# Patient Record
Sex: Male | Born: 1945 | Race: White | Hispanic: No | Smoking: Former smoker
Health system: Southern US, Community
[De-identification: ages and names within clinical notes are randomized; demographics above are authoritative.]

## PROBLEM LIST (undated history)

## (undated) DIAGNOSIS — I671 Cerebral aneurysm, nonruptured: Secondary | ICD-10-CM

## (undated) DIAGNOSIS — K219 Gastro-esophageal reflux disease without esophagitis: Secondary | ICD-10-CM

## (undated) DIAGNOSIS — J189 Pneumonia, unspecified organism: Secondary | ICD-10-CM

## (undated) DIAGNOSIS — R569 Unspecified convulsions: Secondary | ICD-10-CM

## (undated) DIAGNOSIS — I639 Cerebral infarction, unspecified: Secondary | ICD-10-CM

## (undated) DIAGNOSIS — I10 Essential (primary) hypertension: Secondary | ICD-10-CM

## (undated) DIAGNOSIS — E7800 Pure hypercholesterolemia, unspecified: Secondary | ICD-10-CM

## (undated) DIAGNOSIS — N400 Enlarged prostate without lower urinary tract symptoms: Secondary | ICD-10-CM

## (undated) DIAGNOSIS — G40909 Epilepsy, unspecified, not intractable, without status epilepticus: Secondary | ICD-10-CM

## (undated) DIAGNOSIS — G2581 Restless legs syndrome: Secondary | ICD-10-CM

## (undated) HISTORY — PX: KNEE ARTHROPLASTY: SHX992

## (undated) HISTORY — PX: CEREBRAL ANEURYSM REPAIR: SHX164

## (undated) HISTORY — PX: CHOLECYSTECTOMY: SHX55

---

## 1999-01-24 ENCOUNTER — Encounter: Payer: Self-pay | Admitting: Emergency Medicine

## 1999-01-24 ENCOUNTER — Emergency Department (HOSPITAL_COMMUNITY): Admission: EM | Admit: 1999-01-24 | Discharge: 1999-01-25 | Payer: Self-pay | Admitting: Emergency Medicine

## 1999-08-12 ENCOUNTER — Emergency Department (HOSPITAL_COMMUNITY): Admission: EM | Admit: 1999-08-12 | Discharge: 1999-08-12 | Payer: Self-pay | Admitting: Emergency Medicine

## 1999-08-12 ENCOUNTER — Encounter: Payer: Self-pay | Admitting: Emergency Medicine

## 2000-05-22 ENCOUNTER — Ambulatory Visit: Admission: RE | Admit: 2000-05-22 | Discharge: 2000-05-22 | Payer: Self-pay | Admitting: *Deleted

## 2000-06-11 ENCOUNTER — Emergency Department (HOSPITAL_COMMUNITY): Admission: EM | Admit: 2000-06-11 | Discharge: 2000-06-11 | Payer: Self-pay | Admitting: Emergency Medicine

## 2000-06-27 ENCOUNTER — Encounter: Payer: Self-pay | Admitting: Surgery

## 2000-06-27 ENCOUNTER — Observation Stay (HOSPITAL_COMMUNITY): Admission: RE | Admit: 2000-06-27 | Discharge: 2000-06-28 | Payer: Self-pay | Admitting: Surgery

## 2000-06-27 ENCOUNTER — Encounter (INDEPENDENT_AMBULATORY_CARE_PROVIDER_SITE_OTHER): Payer: Self-pay

## 2003-10-13 ENCOUNTER — Encounter: Payer: Self-pay | Admitting: Emergency Medicine

## 2003-10-14 ENCOUNTER — Inpatient Hospital Stay (HOSPITAL_COMMUNITY): Admission: EM | Admit: 2003-10-14 | Discharge: 2003-10-16 | Payer: Self-pay

## 2003-10-15 ENCOUNTER — Encounter: Payer: Self-pay | Admitting: Cardiology

## 2009-06-08 ENCOUNTER — Encounter (INDEPENDENT_AMBULATORY_CARE_PROVIDER_SITE_OTHER): Payer: Self-pay | Admitting: Internal Medicine

## 2009-06-08 ENCOUNTER — Ambulatory Visit: Payer: Self-pay | Admitting: Nurse Practitioner

## 2009-06-08 ENCOUNTER — Observation Stay (HOSPITAL_COMMUNITY): Admission: EM | Admit: 2009-06-08 | Discharge: 2009-06-08 | Payer: Self-pay | Admitting: Emergency Medicine

## 2009-07-01 ENCOUNTER — Emergency Department (HOSPITAL_COMMUNITY): Admission: EM | Admit: 2009-07-01 | Discharge: 2009-07-02 | Payer: Self-pay | Admitting: Emergency Medicine

## 2010-10-19 LAB — RAPID URINE DRUG SCREEN, HOSP PERFORMED
Cocaine: NOT DETECTED
Tetrahydrocannabinol: NOT DETECTED

## 2010-10-19 LAB — POCT CARDIAC MARKERS
CKMB, poc: 3.1 ng/mL (ref 1.0–8.0)
Troponin i, poc: <0.05 ng/mL (ref 0.00–0.09)

## 2010-10-19 LAB — CBC
HCT: 40.3 % (ref 39.0–52.0)
Hemoglobin: 13.6 g/dL (ref 13.0–17.0)
MCHC: 33.7 g/dL (ref 30.0–36.0)
MCV: 97.3 fL (ref 78.0–100.0)
RBC: 4.15 MIL/uL — ABNORMAL LOW (ref 4.22–5.81)
RDW: 12.9 % (ref 11.5–15.5)

## 2010-10-19 LAB — COMPREHENSIVE METABOLIC PANEL
ALT: 34 U/L (ref 0–53)
BUN: 13 mg/dL (ref 6–23)
CO2: 28 mEq/L (ref 19–32)
Calcium: 9.2 mg/dL (ref 8.4–10.5)
GFR calc non Af Amer: >60 mL/min (ref >60)
Glucose, Bld: 102 mg/dL — ABNORMAL HIGH (ref 70–99)
Total Protein: 6.5 g/dL (ref 6.0–8.3)

## 2010-10-19 LAB — DIFFERENTIAL
Basophils Relative: 0 % (ref 0–1)
Eosinophils Absolute: 0.4 10*3/uL (ref 0.0–0.7)
Lymphs Abs: 1.3 10*3/uL (ref 0.7–4.0)
Monocytes Relative: 5 % (ref 3–12)
Neutro Abs: 8.1 10*3/uL — ABNORMAL HIGH (ref 1.7–7.7)
Neutrophils Relative %: 78 % — ABNORMAL HIGH (ref 43–77)

## 2010-10-19 LAB — PROTIME-INR
INR: 0.94 (ref 0.00–1.49)
Prothrombin Time: 12.5 seconds (ref 11.6–15.2)

## 2010-10-19 LAB — APTT: aPTT: 28 seconds (ref 24–37)

## 2010-10-20 LAB — POCT CARDIAC MARKERS

## 2010-10-20 LAB — TROPONIN I
Troponin I: 0.01 ng/mL (ref 0.00–0.06)
Troponin I: 0.01 ng/mL (ref 0.00–0.06)

## 2010-10-20 LAB — POCT I-STAT, CHEM 8
BUN: 16 mg/dL (ref 6–23)
Calcium, Ion: 1.13 mmol/L (ref 1.12–1.32)
HCT: 40 % (ref 39.0–52.0)
Hemoglobin: 13.6 g/dL (ref 13.0–17.0)
Sodium: 141 mEq/L (ref 135–145)
TCO2: 27 mmol/L (ref 0–100)

## 2010-10-20 LAB — DIFFERENTIAL
Basophils Absolute: 0 10*3/uL (ref 0.0–0.1)
Basophils Relative: 0 % (ref 0–1)
Eosinophils Absolute: 0.2 10*3/uL (ref 0.0–0.7)
Eosinophils Relative: 3 % (ref 0–5)
Neutrophils Relative %: 63 % (ref 43–77)

## 2010-10-20 LAB — CBC
Platelets: 403 10*3/uL — ABNORMAL HIGH (ref 150–400)
RDW: 12.5 % (ref 11.5–15.5)
WBC: 7.3 10*3/uL (ref 4.0–10.5)

## 2010-10-20 LAB — CK TOTAL AND CKMB (NOT AT ARMC)
CK, MB: 5.5 ng/mL — ABNORMAL HIGH (ref 0.3–4.0)
Relative Index: 1.8 (ref 0.0–2.5)
Total CK: 290 U/L — ABNORMAL HIGH (ref 7–232)
Total CK: 304 U/L — ABNORMAL HIGH (ref 7–232)

## 2010-10-20 LAB — D-DIMER, QUANTITATIVE: D-Dimer, Quant: 0.26 ug/mL-FEU (ref 0.00–0.48)
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
## 2012-01-02 ENCOUNTER — Emergency Department (HOSPITAL_BASED_OUTPATIENT_CLINIC_OR_DEPARTMENT_OTHER)
Admission: EM | Admit: 2012-01-02 | Discharge: 2012-01-03 | Disposition: A | Discharge status: Other Acute Inpatient Hospital | Payer: Self-pay | Attending: Emergency Medicine | Admitting: Emergency Medicine

## 2012-01-02 ENCOUNTER — Emergency Department (HOSPITAL_BASED_OUTPATIENT_CLINIC_OR_DEPARTMENT_OTHER): Payer: Self-pay

## 2012-01-02 ENCOUNTER — Encounter (HOSPITAL_BASED_OUTPATIENT_CLINIC_OR_DEPARTMENT_OTHER): Payer: Self-pay | Admitting: Student

## 2012-01-02 DIAGNOSIS — R42 Dizziness and giddiness: Secondary | ICD-10-CM | POA: Insufficient documentation

## 2012-01-02 DIAGNOSIS — G459 Transient cerebral ischemic attack, unspecified: Secondary | ICD-10-CM | POA: Insufficient documentation

## 2012-01-02 DIAGNOSIS — R4182 Altered mental status, unspecified: Secondary | ICD-10-CM | POA: Insufficient documentation

## 2012-01-02 HISTORY — DX: Restless legs syndrome: G25.81

## 2012-01-02 HISTORY — DX: Epilepsy, unspecified, not intractable, without status epilepticus: G40.909

## 2012-01-02 HISTORY — DX: Cerebral aneurysm, nonruptured: I67.1

## 2012-01-02 HISTORY — DX: Pure hypercholesterolemia, unspecified: E78.00

## 2012-01-02 HISTORY — DX: Essential (primary) hypertension: I10

## 2012-01-02 HISTORY — DX: Benign prostatic hyperplasia without lower urinary tract symptoms: N40.0

## 2012-01-02 LAB — COMPREHENSIVE METABOLIC PANEL
BUN: 17 mg/dL (ref 6–23)
CO2: 24 mEq/L (ref 19–32)
Calcium: 9.7 mg/dL (ref 8.4–10.5)
Creatinine, Ser: 0.8 mg/dL (ref 0.50–1.35)
GFR calc Af Amer: >90 mL/min (ref >90)
GFR calc non Af Amer: >90 mL/min (ref >90)
Glucose, Bld: 118 mg/dL — ABNORMAL HIGH (ref 70–99)

## 2012-01-02 LAB — PROTIME-INR: INR: 0.99 (ref 0.00–1.49)

## 2012-01-02 LAB — CBC
HCT: 43 % (ref 39.0–52.0)
MCH: 32.4 pg (ref 26.0–34.0)
MCHC: 35.8 g/dL (ref 30.0–36.0)
MCV: 90.5 fL (ref 78.0–100.0)
Platelets: 352 10*3/uL (ref 150–400)
RDW: 12.5 % (ref 11.5–15.5)

## 2012-01-02 LAB — DIFFERENTIAL
Basophils Absolute: 0 10*3/uL (ref 0.0–0.1)
Eosinophils Absolute: 0.3 10*3/uL (ref 0.0–0.7)
Eosinophils Relative: 3 % (ref 0–5)
Monocytes Absolute: 0.6 10*3/uL (ref 0.1–1.0)

## 2012-01-02 NOTE — ED Notes (Signed)
Pt transported to CT at this time.

## 2012-01-02 NOTE — ED Notes (Signed)
MD at bedside. 

## 2012-01-02 NOTE — ED Notes (Signed)
Pt in via EMS from home with c/o of sudden onset of headache, confusion, extreme dizziness and possible global aphasia. 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 Per EMS pt had trouble finishing sentences and completing thoughts. Resolution upon arrival and pt is able to complete sentence and a/o x 4. Grips = and PERRLA.

## 2012-01-02 NOTE — ED Provider Notes (Signed)
History   This chart was scribed for Noah Lee Noah Cords, MD by [name]. The patient was seen in room MH01/MH01 and the patient's care was started at 11:36 PM     CSN: [id number]  Arrival date & time 01/02/12  2254   First MD Initiated Contact with Patient 01/02/12 2325      Chief Complaint  Patient presents with  . 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   Noah Lee is a 66 y.o. male who presents to the Emergency Department complaining of severe, episodic dizziness onset today with associated symptoms of headache. The pt states "I was out in the sun all day and didn't have a problem." Pt has a hx of brain aneurism in October (2012), for which he received surgical intervention at [facility] in [place], [place]. Patient states he was dizzy this afternoon and went home and then went back to the pool to get mail at approximately 4 pm.  States he had occipital HA and dizziness and felt he was going to fall and couldn't call 911 or finish his sentences.  States his lasted until medics arrived his evening and he states hthey noticed he could not finish his sentence.  Transported here but symptoms resolved.  No weakness that's new.  States this is the same as when his aneurysm was coiled  Pt denies taking coumadin at this time.   PCP is Dr. Arlyce Dice ([facility] [place]). Pt lives in [place], [place].   Brain Surgeon is Dr. Joya Martyr at [facility] in [place], [place].    Past Surgical History  Procedure Date  . Cerebral aneurysm repair     No family history on file.  History  Substance Use Topics  . Smoking status: Not on file  . Smokeless tobacco: Not on file  . Alcohol Use:       Review of Systems  Respiratory: Negative for shortness of breath.   Cardiovascular: Negative for chest pain.    Neurological: Positive for dizziness and speech difficulty. Negative for seizures, facial asymmetry, weakness and numbness.  All other systems reviewed and are negative.    10 Systems reviewed and all are negative for acute change except as noted in the HPI.    Allergies  Review of patient's allergies indicates not on file.  Home Medications   Current Outpatient Rx  Name Route Sig Dispense Refill  . AMLODIPINE BESYLATE 10 MG PO TABS Oral Take 10 mg by mouth daily.    . ATORVASTATIN CALCIUM 20 MG PO TABS Oral Take 10 mg by mouth at bedtime.    . CYANOCOBALAMIN 500 MCG PO TABS Oral Take 1,000 mcg by mouth daily.    Marland Kitchen FINASTERIDE 5 MG PO TABS Oral Take 5 mg by mouth at bedtime.    . OMEGA-3 FATTY ACIDS 1000 MG PO CAPS Oral Take 2 g by mouth 2 (two) times daily.    Marland Kitchen HYDROCHLOROTHIAZIDE 25 MG PO TABS Oral Take 25 mg by mouth daily.    Marland Kitchen LAMOTRIGINE 200 MG PO TABS Oral Take 200 mg by mouth daily.    Marland Kitchen PRAMIPEXOLE DIHYDROCHLORIDE 0.25 MG PO TABS Oral Take 0.25 mg by mouth at bedtime.    Marland Kitchen ZOLPIDEM TARTRATE 10 MG PO TABS Oral Take 10 mg by mouth at bedtime as needed.      BP 153/87  Pulse 72  Temp 98.3  F (36.8 C) (Oral)  Resp 18  SpO2 98%  Physical Exam  Nursing note and vitals reviewed. Constitutional: He is oriented to person, place, and time. He appears well-developed and well-nourished.  HENT:  Head: Normocephalic and atraumatic.  Right Ear: External ear normal.  Left Ear: External ear normal.  Nose: Nose normal.  Mouth/Throat: Oropharynx is clear and moist.  Eyes: EOM are normal. Pupils are equal, round, and reactive to light.  Neck: Normal range of motion. Neck supple.  Cardiovascular: Normal rate, regular rhythm and normal heart sounds.   Pulmonary/Chest: Effort normal and breath sounds normal.  Abdominal: Soft. Bowel sounds are normal. There is no tenderness.  Musculoskeletal: Normal range of motion. He exhibits no edema.  Neurological: He is alert and oriented to  person, place, and time. He has normal reflexes. No cranial nerve deficit. Coordination normal.       Slight pass pointing and dysmetria. Intact speech  Skin: Skin is warm and dry.  Psychiatric: He has a normal mood and affect. His behavior is normal.    ED Course  Procedures (including critical care time)  DIAGNOSTIC STUDIES: Oxygen Saturation is 98% on room air, normal by my interpretation.    COORDINATION OF CARE:   11:40PM- EDP at bedside discusses treatment plan   11:53PM- Phone Consultation with Dr. Dorena Cookey.   12:04Am- Phone Consultation with Dr. Marland Kitchen  Results for orders placed during the hospital encounter of 01/02/12  CBC      Component Value Range   WBC 7.5  4.0 - 10.5 K/uL   RBC 4.75  4.22 - 5.81 MIL/uL   Hemoglobin 15.4  13.0 - 17.0 g/dL   HCT 16.1  09.6 - 04.5 %   MCV 90.5  78.0 - 100.0 fL   MCH 32.4  26.0 - 34.0 pg   MCHC 35.8  30.0 - 36.0 g/dL   RDW 40.9  81.1 - 91.4 %   Platelets 352  150 - 400 K/uL  DIFFERENTIAL      Component Value Range   Neutrophils Relative 58  43 - 77 %   Neutro Abs 4.3  1.7 - 7.7 K/uL   Lymphocytes Relative 30  12 - 46 %   Lymphs Abs 2.2  0.7 - 4.0 K/uL   Monocytes Relative 8  3 - 12 %   Monocytes Absolute 0.6  0.1 - 1.0 K/uL   Eosinophils Relative 3  0 - 5 %   Eosinophils Absolute 0.3  0.0 - 0.7 K/uL   Basophils Relative 0  0 - 1 %   Basophils Absolute 0.0  0.0 - 0.1 K/uL  COMPREHENSIVE METABOLIC PANEL      Component Value Range   Sodium 140  135 - 145 mEq/L   Potassium 3.5  3.5 - 5.1 mEq/L   Chloride 102  96 - 112 mEq/L   CO2 24  19 - 32 mEq/L   Glucose, Bld 118 (*) 70 - 99 mg/dL   BUN 17  6 - 23 mg/dL   Creatinine, Ser 7.82  0.50 - 1.35 mg/dL   Calcium 9.7  8.4 - 95.6 mg/dL   Total Protein 7.5  6.0 - 8.3 g/dL   Albumin 4.4  3.5 - 5.2 g/dL   AST 20  0 - 37 U/L   ALT 23  0 - 53 U/L   Alkaline Phosphatase 58  39 - 117 U/L   Total Bilirubin 0.5  0.3 - 1.2 mg/dL   GFR calc non Af Amer >90  >  90 mL/min   GFR calc Af Amer  >90  >90 mL/min  PROTIME-INR      Component Value Range   Prothrombin Time 13.3  11.6 - 15.2 seconds   INR 0.99  0.00 - 1.49     No results found.   No diagnosis found.    MDM  Will transfer for TIA work up, patient requests [facility]   Meeya Goldin Noah Cords, MD 01/03/12 0865

## 2012-01-02 NOTE — ED Notes (Signed)
Took patient's CBG and result was: 112. MD showed while assessing patient.

## 2012-01-02 NOTE — ED Notes (Signed)
Per EMS: pt had dizziness that began this afternoon while at the pool. Resolved at this time. Pt states he feels like he is having difficulty finishing his sentences, and that this is similar to when he had a brain aneurysm last October which required surgical intervention. Pt alert and denies pain other than a mild headache. Hypertensive at 190 systolic. HR normal.

## 2012-01-03 ENCOUNTER — Encounter (HOSPITAL_BASED_OUTPATIENT_CLINIC_OR_DEPARTMENT_OTHER): Payer: Self-pay | Admitting: Emergency Medicine

## 2012-01-03 LAB — GLUCOSE, CAPILLARY: Glucose-Capillary: 112 mg/dL — ABNORMAL HIGH (ref 70–99)

## 2012-07-16 ENCOUNTER — Encounter (HOSPITAL_COMMUNITY): Payer: Self-pay | Admitting: Emergency Medicine

## 2012-07-16 ENCOUNTER — Observation Stay (HOSPITAL_COMMUNITY)
Admission: EM | Admit: 2012-07-16 | Discharge: 2012-07-20 | Disposition: A | Discharge status: Home / Self Care | Payer: Self-pay | Attending: Family Medicine | Admitting: Family Medicine

## 2012-07-16 ENCOUNTER — Emergency Department (HOSPITAL_COMMUNITY): Payer: Self-pay

## 2012-07-16 DIAGNOSIS — G459 Transient cerebral ischemic attack, unspecified: Principal | ICD-10-CM | POA: Insufficient documentation

## 2012-07-16 DIAGNOSIS — Z8673 Personal history of transient ischemic attack (TIA), and cerebral infarction without residual deficits: Secondary | ICD-10-CM | POA: Insufficient documentation

## 2012-07-16 DIAGNOSIS — N401 Enlarged prostate with lower urinary tract symptoms: Secondary | ICD-10-CM | POA: Insufficient documentation

## 2012-07-16 DIAGNOSIS — R32 Unspecified urinary incontinence: Secondary | ICD-10-CM

## 2012-07-16 DIAGNOSIS — W010XXA Fall on same level from slipping, tripping and stumbling without subsequent striking against object, initial encounter: Secondary | ICD-10-CM | POA: Insufficient documentation

## 2012-07-16 DIAGNOSIS — Z23 Encounter for immunization: Secondary | ICD-10-CM | POA: Insufficient documentation

## 2012-07-16 DIAGNOSIS — M25569 Pain in unspecified knee: Secondary | ICD-10-CM | POA: Insufficient documentation

## 2012-07-16 DIAGNOSIS — I10 Essential (primary) hypertension: Secondary | ICD-10-CM

## 2012-07-16 DIAGNOSIS — S0181XA Laceration without foreign body of other part of head, initial encounter: Secondary | ICD-10-CM

## 2012-07-16 DIAGNOSIS — G40909 Epilepsy, unspecified, not intractable, without status epilepticus: Secondary | ICD-10-CM | POA: Insufficient documentation

## 2012-07-16 DIAGNOSIS — S0180XA Unspecified open wound of other part of head, initial encounter: Secondary | ICD-10-CM | POA: Insufficient documentation

## 2012-07-16 DIAGNOSIS — R4182 Altered mental status, unspecified: Secondary | ICD-10-CM | POA: Insufficient documentation

## 2012-07-16 DIAGNOSIS — Y92009 Unspecified place in unspecified non-institutional (private) residence as the place of occurrence of the external cause: Secondary | ICD-10-CM | POA: Insufficient documentation

## 2012-07-16 DIAGNOSIS — R569 Unspecified convulsions: Secondary | ICD-10-CM

## 2012-07-16 DIAGNOSIS — Z79899 Other long term (current) drug therapy: Secondary | ICD-10-CM | POA: Insufficient documentation

## 2012-07-16 DIAGNOSIS — R42 Dizziness and giddiness: Secondary | ICD-10-CM | POA: Insufficient documentation

## 2012-07-16 DIAGNOSIS — N138 Other obstructive and reflux uropathy: Secondary | ICD-10-CM | POA: Insufficient documentation

## 2012-07-16 DIAGNOSIS — N400 Enlarged prostate without lower urinary tract symptoms: Secondary | ICD-10-CM | POA: Diagnosis present

## 2012-07-16 DIAGNOSIS — R209 Unspecified disturbances of skin sensation: Secondary | ICD-10-CM | POA: Insufficient documentation

## 2012-07-16 HISTORY — DX: Pneumonia, unspecified organism: J18.9

## 2012-07-16 HISTORY — DX: Gastro-esophageal reflux disease without esophagitis: K21.9

## 2012-07-16 HISTORY — DX: Unspecified convulsions: R56.9

## 2012-07-16 HISTORY — DX: Cerebral infarction, unspecified: I63.9

## 2012-07-16 LAB — URINALYSIS, ROUTINE W REFLEX MICROSCOPIC
Bilirubin Urine: NEGATIVE
Hgb urine dipstick: NEGATIVE
Nitrite: NEGATIVE
Protein, ur: NEGATIVE mg/dL
Specific Gravity, Urine: 1.018 (ref 1.005–1.030)
Urobilinogen, UA: 0.2 mg/dL (ref 0.0–1.0)

## 2012-07-16 LAB — CBC WITH DIFFERENTIAL/PLATELET
Basophils Relative: 0 % (ref 0–1)
Eosinophils Absolute: 0.1 10*3/uL (ref 0.0–0.7)
Eosinophils Relative: 2 % (ref 0–5)
MCH: 31 pg (ref 26.0–34.0)
MCHC: 32.9 g/dL (ref 30.0–36.0)
MCV: 94.3 fL (ref 78.0–100.0)
Monocytes Relative: 8 % (ref 3–12)
Neutrophils Relative %: 71 % (ref 43–77)
Platelets: 225 10*3/uL (ref 150–400)

## 2012-07-16 LAB — BASIC METABOLIC PANEL
BUN: 9 mg/dL (ref 6–23)
Calcium: 8.7 mg/dL (ref 8.4–10.5)
GFR calc Af Amer: >90 mL/min (ref >90)
GFR calc non Af Amer: 89 mL/min — ABNORMAL LOW (ref >90)
Potassium: 3.6 mEq/L (ref 3.5–5.1)
Sodium: 140 mEq/L (ref 135–145)

## 2012-07-16 LAB — PROTIME-INR: INR: 0.96 (ref 0.00–1.49)

## 2012-07-16 LAB — RAPID URINE DRUG SCREEN, HOSP PERFORMED
Barbiturates: NOT DETECTED
Benzodiazepines: NOT DETECTED

## 2012-07-16 LAB — GLUCOSE, CAPILLARY: Glucose-Capillary: 93 mg/dL (ref 70–99)

## 2012-07-16 MED ORDER — PRAMIPEXOLE DIHYDROCHLORIDE 0.25 MG PO TABS
0.2500 mg | ORAL_TABLET | Freq: Every day | ORAL | Status: DC
  Administered 2012-07-16 – 2012-07-19 (×4): 0.25 mg via ORAL
  Filled 2012-07-16 (×5): qty 1

## 2012-07-16 MED ORDER — FINASTERIDE 5 MG PO TABS
5.0000 mg | ORAL_TABLET | Freq: Every day | ORAL | Status: DC
  Administered 2012-07-16 – 2012-07-19 (×4): 5 mg via ORAL
  Filled 2012-07-16 (×5): qty 1

## 2012-07-16 MED ORDER — ENOXAPARIN SODIUM 40 MG/0.4ML ~~LOC~~ SOLN
40.0000 mg | SUBCUTANEOUS | Status: DC
  Administered 2012-07-16 – 2012-07-19 (×4): 40 mg via SUBCUTANEOUS
  Filled 2012-07-16 (×5): qty 0.4

## 2012-07-16 MED ORDER — VITAMIN B-1 100 MG PO TABS
100.0000 mg | ORAL_TABLET | Freq: Every day | ORAL | Status: DC
  Administered 2012-07-17 – 2012-07-20 (×5): 100 mg via ORAL
  Filled 2012-07-16 (×6): qty 1

## 2012-07-16 MED ORDER — ATORVASTATIN CALCIUM 10 MG PO TABS
10.0000 mg | ORAL_TABLET | Freq: Every day | ORAL | Status: DC
  Administered 2012-07-16 – 2012-07-19 (×4): 10 mg via ORAL
  Filled 2012-07-16 (×5): qty 1

## 2012-07-16 MED ORDER — FOLIC ACID 1 MG PO TABS
1.0000 mg | ORAL_TABLET | Freq: Every day | ORAL | Status: DC
  Administered 2012-07-17 – 2012-07-20 (×5): 1 mg via ORAL
  Filled 2012-07-16 (×7): qty 1

## 2012-07-16 MED ORDER — AMLODIPINE BESYLATE 10 MG PO TABS
10.0000 mg | ORAL_TABLET | Freq: Every day | ORAL | Status: DC
  Administered 2012-07-16 – 2012-07-20 (×5): 10 mg via ORAL
  Filled 2012-07-16 (×5): qty 1

## 2012-07-16 MED ORDER — OXYCODONE-ACETAMINOPHEN 5-325 MG PO TABS
1.0000 | ORAL_TABLET | Freq: Every day | ORAL | Status: DC | PRN
  Administered 2012-07-16 – 2012-07-17 (×3): 1 via ORAL
  Filled 2012-07-16 (×3): qty 1

## 2012-07-16 MED ORDER — INFLUENZA VIRUS VACC SPLIT PF IM SUSP
0.5000 mL | INTRAMUSCULAR | Status: AC
  Administered 2012-07-17: 0.5 mL via INTRAMUSCULAR
  Filled 2012-07-16: qty 0.5

## 2012-07-16 MED ORDER — ACETAMINOPHEN 325 MG PO TABS
650.0000 mg | ORAL_TABLET | ORAL | Status: DC | PRN
  Administered 2012-07-18: 650 mg via ORAL
  Filled 2012-07-16: qty 2

## 2012-07-16 MED ORDER — LORAZEPAM 1 MG PO TABS
1.0000 mg | ORAL_TABLET | Freq: Four times a day (QID) | ORAL | Status: AC | PRN
  Administered 2012-07-18: 1 mg via ORAL
  Filled 2012-07-16: qty 1

## 2012-07-16 MED ORDER — LAMOTRIGINE 200 MG PO TABS
200.0000 mg | ORAL_TABLET | Freq: Every day | ORAL | Status: DC
  Administered 2012-07-16 – 2012-07-20 (×5): 200 mg via ORAL
  Filled 2012-07-16 (×5): qty 1

## 2012-07-16 MED ORDER — LORAZEPAM 2 MG/ML IJ SOLN
1.0000 mg | Freq: Four times a day (QID) | INTRAMUSCULAR | Status: AC | PRN
  Administered 2012-07-17: 1 mg via INTRAVENOUS
  Filled 2012-07-16: qty 1

## 2012-07-16 MED ORDER — THIAMINE HCL 100 MG/ML IJ SOLN
100.0000 mg | Freq: Every day | INTRAMUSCULAR | Status: DC
  Filled 2012-07-16 (×2): qty 1

## 2012-07-16 MED ORDER — HYDROCHLOROTHIAZIDE 25 MG PO TABS
25.0000 mg | ORAL_TABLET | Freq: Every day | ORAL | Status: DC
  Administered 2012-07-16 – 2012-07-20 (×5): 25 mg via ORAL
  Filled 2012-07-16 (×5): qty 1

## 2012-07-16 MED ORDER — ADULT MULTIVITAMIN W/MINERALS CH
1.0000 | ORAL_TABLET | Freq: Every day | ORAL | Status: DC
  Administered 2012-07-17 – 2012-07-20 (×5): 1 via ORAL
  Filled 2012-07-16 (×6): qty 1

## 2012-07-16 NOTE — ED Notes (Signed)
The pt is alert he has 2 ivs.  He appears comfortable no pain.  C/o the same numbness he has had in both his hands from the start.  Npo  Until he gets a swallow evaluatiion.  He has a history of difficulty swallowing

## 2012-07-16 NOTE — ED Provider Notes (Addendum)
History     CSN: [id number]  Arrival date & time 07/16/12  1444   First MD Initiated Contact with Patient 07/16/12 1508      Chief Complaint  Patient presents with  . Dizziness  . Numbness    (Consider location/radiation/quality/duration/timing/severity/associated sxs/prior treatment) HPI Comments: Patient states this morning he woke up and he was trying to call the phone number around 10 AM and was confused and could not dial the number. He states then both hands felt numb and tingly and he was dizzy. He states when he tried to walk he lost balance falling hitting his chin on a drum.  He states the dizziness is worse when he gets up to move around and is better when he lies still. He states he had this once before when he had a TIA approximately 6 or 8 months ago. He also has a history of a cerebral aneurysm that has been coiled in the past. He states he's had seizures after this was done but states today did not feel like a seizure and he still is complaining of being mildly confused.  Patient is a 66 y.o. male presenting with altered mental status. The history is provided by the patient.  Altered Mental Status This is a new problem. The current episode started 6 to 12 hours ago. The problem occurs constantly. The problem has been gradually improving. Pertinent negatives include no chest pain, no abdominal pain and no headaches. Associated symptoms comments: Dizziness that is worse with attempting to walk or moving his head. The symptoms are aggravated by walking. Relieved by: Lying still. He has tried nothing for the symptoms. The treatment provided moderate relief.    Past Medical History  Diagnosis Date  . Hypertension   . Restless leg syndrome   . High cholesterol   . Benign prostatic hyperplasia   . Cerebral aneurysm   . Seizure disorder     Past Surgical History  Procedure Date  . Cerebral aneurysm repair   . Knee arthroplasty   . Cerebral aneurysm repair   .  Cholecystectomy     No family history on file.  History  Substance Use Topics  . Smoking status: Former Games developer  . Smokeless tobacco: Never Used  . Alcohol Use: Yes     Comment: occasional      Review of Systems  Constitutional: Negative for fever.  Cardiovascular: Negative for chest pain.  Gastrointestinal: Negative for abdominal pain.  Neurological: Negative for headaches.  Psychiatric/Behavioral: Positive for altered mental status.  All other systems reviewed and are negative.    Allergies  Review of patient's allergies indicates no known allergies.  Home Medications   Current Outpatient Rx  Name  Route  Sig  Dispense  Refill  . AMLODIPINE BESYLATE 10 MG PO TABS   Oral   Take 10 mg by mouth daily.         . ATORVASTATIN CALCIUM 20 MG PO TABS   Oral   Take 10 mg by mouth at bedtime.         . CYANOCOBALAMIN 500 MCG PO TABS   Oral   Take 1,000 mcg by mouth daily.         Marland Kitchen FINASTERIDE 5 MG PO TABS   Oral   Take 5 mg by mouth at bedtime.         . OMEGA-3 FATTY ACIDS 1000 MG PO CAPS   Oral   Take 2 g by mouth 2 (two) times daily.         Marland Kitchen  HYDROCHLOROTHIAZIDE 25 MG PO TABS   Oral   Take 25 mg by mouth daily.         Marland Kitchen LAMOTRIGINE 200 MG PO TABS   Oral   Take 200 mg by mouth daily.         . OXYCODONE-ACETAMINOPHEN 5-325 MG PO TABS   Oral   Take 1 tablet by mouth daily as needed. For pain         . PRAMIPEXOLE DIHYDROCHLORIDE 0.25 MG PO TABS   Oral   Take 0.25 mg by mouth at bedtime.         . TRAZODONE HCL 100 MG PO TABS   Oral   Take 100 mg by mouth at bedtime.           BP 152/74  Pulse 76  Temp 98.7 F (37.1 C) (Oral)  Resp 18  Ht 5\' 11"  (1.803 m)  Wt 270 lb (122.471 kg)  BMI 37.66 kg/m2  SpO2 98%  Physical Exam  Nursing note and vitals reviewed. Constitutional: He is oriented to person, place, and time. He appears well-developed and well-nourished. No distress.  HENT:  Head: Normocephalic and atraumatic.    Mouth/Throat: Oropharynx is clear and moist.  Eyes: Conjunctivae normal and EOM are normal. Pupils are equal, round, and reactive to light. Right eye exhibits no discharge. Left eye exhibits no discharge.       photophobia  Neck: Normal range of motion. Neck supple. No spinous process tenderness and no muscular tenderness present. No rigidity. Normal range of motion present. No Brudzinski's sign and no Kernig's sign noted.  Cardiovascular: Normal rate, regular rhythm, normal heart sounds and intact distal pulses.   No murmur heard. Pulmonary/Chest: Effort normal and breath sounds normal. No respiratory distress. He has no wheezes. He has no rales.  Abdominal: Soft. He exhibits no distension. There is no tenderness. There is no CVA tenderness.  Musculoskeletal: He exhibits no edema and no tenderness.  Lymphadenopathy:    He has no cervical adenopathy.  Neurological: He is alert and oriented to person, place, and time. He has normal strength. No cranial nerve deficit or sensory deficit. GCS eye subscore is 4. GCS verbal subscore is 5. GCS motor subscore is 6.  Reflex Scores:      Patellar reflexes are 1+ on the right side and 1+ on the left side.      Mild past pointing with finger to nose.  Forgets in the middle of the activity what he is doing.  Normal visual fields  Skin: Skin is warm and dry. No rash noted.  Psychiatric: He has a normal mood and affect. His behavior is normal.    ED Course  Procedures (including critical care time)  Labs Reviewed  CBC WITH DIFFERENTIAL - Abnormal; Notable for the following:    RBC 4.03 (*)     Hemoglobin 12.5 (*)     HCT 38.0 (*)     All other components within normal limits  BASIC METABOLIC PANEL - Abnormal; Notable for the following:    Glucose, Bld 144 (*)     GFR calc non Af Amer 89 (*)     All other components within normal limits  PROTIME-INR  APTT  URINALYSIS, ROUTINE W REFLEX MICROSCOPIC   Dg Chest 2 View  07/16/2012  *RADIOLOGY  REPORT*  Clinical Data: Dizziness.  Facial injury.  Altered mental status.  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  Thoracic spondylosis noted.  The lungs appear clear.  No pleural effusion observed.  IMPRESSION:  1.  Thoracic spondylosis.  No acute findings.   Original Report Authenticated By: Gaylyn Rong, M.D.    Ct Head Wo Contrast  07/16/2012  *RADIOLOGY REPORT*  Clinical Data: Dizziness and falling.  Trauma to face.  CT HEAD WITHOUT CONTRAST  Technique:  Contiguous axial images were obtained from the base of the skull through the vertex without contrast.  Comparison: 01/02/2012  Findings: Bone windows demonstrate near complete opacification of the left greater than right maxillary sinuses.  Moderate ethmoid and mild sphenoid sinus mucosal thickening.  Minimal frontal sinus mucosal thickening. Clear mastoid air cells.  Soft tissue windows demonstrate advanced for age cerebral atrophy and small vessel ischemic change.  Prior aneurysm coiling in the region of the left side of the circle of Willis.  This results in mild beam hardening artifact. No  mass lesion, hemorrhage, hydrocephalus, acute infarct, intra-axial, or extra-axial fluid collection.  IMPRESSION:  1. No acute intracranial abnormality. 2.  Age advanced cerebral atrophy and small vessel ischemic change. 3.  Advanced sinus disease.   Original Report Authenticated By: Jeronimo Greaves, M.D.      Date: 07/16/2012  Rate: 76  Rhythm: normal sinus rhythm  QRS Axis: normal  Intervals: normal  ST/T Wave abnormalities: normal  Conduction Disutrbances:none  Narrative Interpretation:   Old EKG Reviewed: unchanged  LACERATION REPAIR Performed by: Gwyneth Sprout Authorized byGwyneth Sprout Consent: Verbal consent obtained. Risks and benefits: risks, benefits and alternatives were discussed Consent given by: patient Patient identity confirmed:  provided demographic data Prepped and Draped in normal sterile fashion Wound explored  Laceration Location: chin  Laceration Length: 2cm  No Foreign Bodies seen or palpated  Anesthesia: local infiltration  Local anesthetic: lidocaine 1% with epinephrine  Anesthetic total: 2 ml  Irrigation method: syringe Amount of cleaning: standard  Skin closure: 4.0 Prolene   Number of sutures: 2   Technique: Simple interrupted   Patient tolerance: Patient tolerated the procedure well with no immediate complications.   1. Chin laceration   2. Dizziness   3. Altered mental status       MDM   Patient with a history of cerebral aneurysm status post coiling, TIA, seizure disorder who states this morning he woke up and was normal but then had dizziness, confusion and numbness in both hands. Due to the dizziness he fell with a small laceration to his chin. On exam here he is still mildly confused and has some mild past pointing but otherwise a normal neurologic exam. His GCS is 15. Initially he was hypertensive at 210/100 which is now improved 150/80. Concern for TIA, clots or atypical seizure however patient states he's taking his seizure medications and this does not feel like any of his past seizures. CBC, BMP, UA, PT, PTT, head CT pending. EKG is within normal limits. Patient denies headache, neck pain, fever.  4:56 PM Labs are within normal limits. Head CT is negative for acute changes. However patient is still symptomatic on exam and feel observation and MRI are warranted for further evaluation.      Gwyneth Sprout, MD 07/16/12 1658  Gwyneth Sprout, MD 07/16/12 1747

## 2012-07-16 NOTE — ED Notes (Signed)
Pt urinated all over hisself and the stretcher; Lowella Bandy, RN assisted pt with cleaning hisself and changed the stretcher; gave pt another gown to put on; placed pt back on the monitor, continuous pulse oximetry and blood pressure cuff; daughter at bedside now; no urine specimen was able to be obtained

## 2012-07-16 NOTE — H&P (Signed)
Family Medicine Teaching [facility] Admission History and Physical Service Pager: [phone]  Patient name: Noah Lee Medical record number: [id number] Date of birth: 26-Jun-1946 Age: 66 y.o. Gender: male  Primary Care Provider:  Dr. Remi Deter, at the [facility] in [place]  Chief Complaint: dizziness, confusion  History of Present Illness: Noah Lee is a 66 y.o. year old male with a PMH of HTN and, aneurysm, and hx CVA x 2, presenting with confusion and dizziness that started around 11am. He was trying to check his bank account balance when he got confused and could not finish the address. He began to get dizzy and stood up when he fell after tripping on a stand and hit his chin on a drum. He states that since that time he has had numbness in his arms BL from his fingterips to his shoulders which has improved but not completely resolved. He called EMS and was brought to our ED. He denies any symptoms last night and felt fine this morning.  His daughters state that his speech has seemed slurred since they came to see him in the ED today, but patient disagrees. They also say that he has seemed "slow" since he began taking trazadone for sleep about 1 week ago. He denies weakness, drooling, seizure activity, headache, vision change, chest pain, dyspnea, diarrhea, and dysuria. He has had some difficulty swallowing for the last 1 month for which he avoids certain foods and large bites because he says they go down very slow.  He can drink fluids without issue.  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  He lives at home with one daughter and he is retired from [employer].  He stopped smoking cigarettes 30 years ago and denies any drug use. He is a recovering alcoholic but has been drinking 2-4 drinks approximately once every 2 weeks or so.    Past Medical History: Past Medical History  Diagnosis Date  . Hypertension   .  Restless leg syndrome   . High cholesterol   . Benign prostatic hyperplasia   . Cerebral aneurysm   . Seizure disorder    Past Surgical History: Past Surgical History  Procedure Date  . Cerebral aneurysm repair   . Knee arthroplasty   . Cerebral aneurysm repair   . Cholecystectomy    Social History: History  Substance Use Topics  . Smoking status: Former Games developer  . Smokeless tobacco: Never Used  . Alcohol Use: Yes     Comment: occasional   For any additional social history documentation, please refer to relevant sections of EMR.  Family History: No family history on file. Allergies: No Known Allergies No current facility-administered medications on file prior to encounter.   Current Outpatient Prescriptions on File Prior to Encounter  Medication Sig Dispense Refill  . amLODipine (NORVASC) 10 MG tablet Take 10 mg by mouth daily.      Marland Kitchen atorvastatin (LIPITOR) 20 MG tablet Take 10 mg by mouth at bedtime.      . cyanocobalamin 500 MCG tablet Take 1,000 mcg by mouth daily.      . finasteride (PROSCAR) 5 MG tablet Take 5 mg by mouth at bedtime.      . fish oil-omega-3 fatty acids 1000 MG capsule Take 2 g by mouth 2 (two) times daily.      . hydrochlorothiazide (HYDRODIURIL) 25 MG tablet Take 25 mg by mouth daily.      Marland Kitchen  lamoTRIgine (LAMICTAL) 200 MG tablet Take 200 mg by mouth daily.      . pramipexole (MIRAPEX) 0.25 MG tablet Take 0.25 mg by mouth at bedtime.      . traZODone (DESYREL) 100 MG tablet Take 100 mg by mouth at bedtime.       Review Of Systems: Per HPI , Otherwise 12 point review of systems was performed and was unremarkable.  Physical Exam: BP 149/74  Pulse 70  Temp 98.7 F (37.1 C) (Oral)  Resp 18  Ht 5\' 11"  (1.803 m)  Wt 270 lb (122.471 kg)  BMI 37.66 kg/m2  SpO2 97% Exam:  Gen: NAD, alert, cooperative with exam, sits up in bed with moderate effort.  HEENT: NCAT, EOMI, PERRL, MMM, missing top teeth CV: RRR, good S1/S2, no murmur Resp: CTABL, no  wheezes, non-labored Abd: SNTND, BS present, no guarding or organomegaly Ext: No edema, warm, 2+ DP and radial pulses, Longitudinal scar over R knee Neuro: Alert and oriented times 3, strength 5/5 in BL upper and lower extremities, Sensation intact in all four extremities, CN 2-12 intact  Labs and Imaging: CBC BMET   Lab 07/16/12 1530  WBC 5.9  HGB 12.5*  HCT 38.0*  PLT 225    Lab 07/16/12 1530  NA 140  K 3.6  CL 103  CO2 27  BUN 9  CREATININE 0.86  GLUCOSE 144*  CALCIUM 8.7     07/16/12:  CT head: Negative  CXR: thoracic spondylosis and no acute findings  Coags within normal limits   Assessment and Plan: Noah Lee is a 66 y.o. year old male with PMH of HTN aneurysm s/p coiling, and CVA times 2 presenting presenting with dizziness, confusion, and numbness since 11 am this morning. We feel that he is most likely having a TIA given his history of previous CVA and slow resolution of symptoms throughout today. With his previous coiling he was not a candidate for TPA and no code stroke was called.  Other considerations as to the etiology of the symptoms include substance influence with his recently started trazadone although this is less likely with the sudden onset of symptoms and also their onset 1 week after initiation of treatment.   TIA with Hx of aneurysm s/p coiliing procedure.  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  TTE - EF 50%. CT angio neck/head - no significant occlusive or stenotic extra or intracranial disease. - ABCD2 Score of 5 indicating appropriate for hospitalization with a 2-Day Stroke Risk 4.1%, 7-Day Stroke Risk 5.9% and a 90-Day Stroke Risk of 9.8%. - Admit to tele for observation - TPA contraindicated with previous hemmorhage - Consider MRI brain - Consider asking Neurology about restarting ASA - Q4 neuro checks  per routine - Consider Carotid doppler, no stenotic flow changes or critical diameters on study from 12/2011 - Follow for further resolution of symptoms - PT, OT, and bedside swallow study per TIA protocol   Seizure d/o - Stable per patient, likely not the source of mental status change as he fells it is very different than his typical seizure and no seizure like activity has been witnesssed by family, ems, or ED personnel - Continue home dose of Lamictal - Follow up UDS  HTN - Reasonable today, per his report it was over 200 systolic on the ambulance, so we will monitor it closely - Continue  home meds- amlodipine 10 qd and  HCTZ 25 qd  Chronic Knee pain - S/p R knee arthroplasty and long Hx of arthritis causing difficulty to walk - Up with assistance  - Home dose of oxycodone- 5 mg PRN daily - Follow up PT/OT recommendations  Urinary incontinence - Will monitor and continue Proscar  FEN/GI: heart healthy diet after passes RN swallow eval Prophylaxis: lovenox Disposition: Home pending work up and improvement Code Status: Full   Kevin Fenton, MD 07/16/2012, 5:07 PM  PGY-3 Addendum Patient seen, examined independently. Available data reviewed. Agree with findings, assessment, and plan as outlined by Dr. Ermalinda Memos.  My additional findings are documented and highlighted above in blue.  [place], DO 07/16/2012 8:05 PM

## 2012-07-16 NOTE — ED Notes (Signed)
Patient transported to X-ray 

## 2012-07-16 NOTE — ED Notes (Signed)
Pt moved from hallway into room; pt undressed, in gown, on monitor, continuous pulse oximetry and blood pressure cuff

## 2012-07-16 NOTE — ED Notes (Signed)
Pt has returned from being out of the department; pt placed back on monitor, continuous pulse oximetry and blood pressure cuff; pt talking on the phone at this time to his daughter

## 2012-07-16 NOTE — ED Notes (Signed)
Per EMS- pt has numbness in both hands and dizziness that began at 1045 am per pt with L[facility] last night.  No neuro deficits with EMS. Pt fell when he became dizzy and hit chin on a drum. Denies LOC. BP 210/100 initial now 150/80. 20g in left hand.

## 2012-07-16 NOTE — ED Notes (Signed)
Pt still out of the department at this time 

## 2012-07-16 NOTE — ED Notes (Addendum)
Pt reports feeling dizzy and falling.  When he fell he hit chin on snare drum.  Pt denies LOC but states he was really confused after the fall and both arms felt numb to the point that he couldn't use them to assist self up.  Pt reports that he falls a lot.  Pt alert oriented X4

## 2012-07-16 NOTE — ED Notes (Signed)
The pt moved over from b to hold for an admitted bed.  Alert on arrival dx confusion

## 2012-07-17 ENCOUNTER — Observation Stay (HOSPITAL_COMMUNITY): Payer: Self-pay

## 2012-07-17 DIAGNOSIS — I10 Essential (primary) hypertension: Secondary | ICD-10-CM | POA: Diagnosis present

## 2012-07-17 DIAGNOSIS — G40909 Epilepsy, unspecified, not intractable, without status epilepticus: Secondary | ICD-10-CM | POA: Diagnosis present

## 2012-07-17 DIAGNOSIS — S0180XA Unspecified open wound of other part of head, initial encounter: Secondary | ICD-10-CM

## 2012-07-17 DIAGNOSIS — G459 Transient cerebral ischemic attack, unspecified: Secondary | ICD-10-CM | POA: Diagnosis present

## 2012-07-17 DIAGNOSIS — N400 Enlarged prostate without lower urinary tract symptoms: Secondary | ICD-10-CM | POA: Diagnosis present

## 2012-07-17 LAB — LIPID PANEL
LDL Cholesterol: 63 mg/dL (ref 0–99)
Total CHOL/HDL Ratio: 2.5 RATIO
Triglycerides: 116 mg/dL (ref <150)
VLDL: 23 mg/dL (ref 0–40)

## 2012-07-17 MED ORDER — OXYCODONE-ACETAMINOPHEN 5-325 MG PO TABS
1.0000 | ORAL_TABLET | Freq: Four times a day (QID) | ORAL | Status: DC | PRN
  Administered 2012-07-17 – 2012-07-19 (×5): 1 via ORAL
  Filled 2012-07-17 (×5): qty 1

## 2012-07-17 MED ORDER — LORAZEPAM 2 MG/ML IJ SOLN: 1.0000 mg | INTRAMUSCULAR | Status: DC | PRN

## 2012-07-17 MED ORDER — PROCHLORPERAZINE EDISYLATE 5 MG/ML IJ SOLN
10.0000 mg | Freq: Four times a day (QID) | INTRAMUSCULAR | Status: DC | PRN
  Administered 2012-07-18: 10 mg via INTRAVENOUS
  Filled 2012-07-17 (×3): qty 2

## 2012-07-17 NOTE — Progress Notes (Signed)
Pt states that bilateral hand and arm numbness and tingling.  Medicated for complaints of headache, then medicated in prior to MRI, as was told that he would be going down shortly. At 0600 still waiting for MRI.  Pts family at bedside.  No complaints voiced at this time.

## 2012-07-17 NOTE — Procedures (Signed)
History: 66 yo M with intermittent episodes of bilateral arm tingling, confusion.   Sedation: None  Background: There is a well defined posterior dominant rhythm of 9 Hz that attenuates with eye opening. No sleep or drowsiness was recorded.  Photic stimulation: Physiologic driving is not performed.   EEG Diagnosis: Normal EEG  Clinical Interpretation: This normal EEG is recorded in the waking state. There was no seizure or seizure predisposition recorded on this study.   Ritta Slot, MD [facility] [phone]  If 7pm- 7am, please page neurology on call at [phone].

## 2012-07-17 NOTE — Progress Notes (Signed)
Family Medicine Teaching Service Daily Progress Note Service Page: [phone]  Patient Assessment: 66 y.o. year old male with PMH of HTN aneurysm s/p coiling, and CVA times 2 presenting presenting with a TIA.   Subjective: Numbness and tingling of BL hands improved but not resolved. Confusion resolved adn he is not having a hard time making sentences anymore. Slept well and good appetite. No chest pain, dyspnea, or weakness.   Objective: Temp:  [98.1 F (36.7 C)-98.7 F (37.1 C)] 98.2 F (36.8 C) (12/31 0553) Pulse Rate:  [68-76] 69  (12/31 0553) Resp:  [17-20] 18  (12/31 0553) BP: (137-152)/(66-128) 137/79 mmHg (12/31 0553) SpO2:  [95 %-100 %] 95 % (12/31 0553) Weight:  [270 lb (122.471 kg)] 270 lb (122.471 kg) (12/30 1444) Exam: Gen: NAD, alert, cooperative with exam HEENT: NCAT, EOMI, PERRL, MMM, missing top teeth  CV: RRR, good S1/S2, no murmur  Resp: CTABL, no wheezes, non-labored  Abd: SNTND, BS present, no guarding or organomegaly  Ext: No edema, warm, 2+ DP and radial pulses, Longitudinal scar over R knee  Neuro: Alert and oriented times 3, strength 5/5 in BL upper and lower extremities, Sensation intact in all four extremities  I have reviewed the patient's medications, labs, imaging, and diagnostic testing.  Notable results are summarized below.  CBC BMET   Lab 07/16/12 1530  WBC 5.9  HGB 12.5*  HCT 38.0*  PLT 225    Lab 07/16/12 1530  NA 140  K 3.6  CL 103  CO2 27  BUN 9  CREATININE 0.86  GLUCOSE 144*  CALCIUM 8.7     Imaging/Diagnostic Tests: 07/16/12:  CT head: Negative  CXR: thoracic spondylosis and no acute findings  Plan: Noah Lee is a 66 y.o. year old male with PMH of HTN aneurysm s/p coiling, and CVA times 2 presenting presenting with a TIA.   TIA with Hx of aneurysm s/p coiliing procedure - symptoms continue to Improve - ABCD2 Score of 5 - Recent work up In 12/2011 at [facility], carotid dopplers - no stenotic flow or critical  diameters, MRI head - multiple infarcts (new and old) and evidence of subarachnoid hemorrhage (old). TTE - EF 50%. CT angio neck/head - no significant occlusive or stenotic extra or intracranial disease.  - continue tele   - TPA contraindicated with previous hemmorhage  - Consider MRI brain, carotid doppler, will consult neuro.  - Consider Carotid doppler, no stenotic flow changes or critical diameters on study from 12/2011  - Follow for further resolution of symptoms  - follow up PT/OT reccs - Continue Lipitor, consider starting plavix - LDL 63, A1C 6.2 ( pre-diabetic)  - Neurology consulted- appreciate reccomendations  Seizure d/o  - Stable - UDS negative for substances  - Continue home dose of Lamictal    HTN  - Reasonable today - Continue home meds- amlodipine 10 qd and HCTZ 25 qd   Chronic Knee pain  - S/p R knee arthroplasty and long Hx of arthritis causing difficulty to walk  - Up with assistance  - Home dose of oxycodone- 5 mg PRN daily  - Follow up PT/OT recommendations   Urinary incontinence  - Will monitor and continue Proscar   FEN/GI: heart healthy diet after passes RN swallow eval  Prophylaxis: lovenox  Disposition: Home pending work up and improvement  Code Status: Full  Kevin Fenton, MD 07/17/2012, 9:36 AM

## 2012-07-17 NOTE — Progress Notes (Signed)
EEG completed.

## 2012-07-17 NOTE — Progress Notes (Signed)
FMTS Attending Note Patient seen and examined by me today, discussed with resident team.  See H&P for further details.  Paula Compton, MD
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
## 2012-07-17 NOTE — H&P (Signed)
FMTS Attending Admit Note Patient seen and examined by me today, discussed with resident team and I agree with Dr Tye Savoy' assess/plan.  This morning patient complains of acute-onset severe headache, as well as continued bilateral hand tingling.  He ascribes his recent falls to weakness and pain associated with his bilateral knee arthritis.  Patient's PMHx is significant for subarachnoid hemorrhage with aneurysmal coiling; prior ischaemic stroke; reported seizure history in the past.  Agree with plan for MRI brain; EEG to assess for possible seizure as etiology of presenting complaint. Antiplatelet therapy for secondary ischaemic stroke prevention.  Paula Compton, MD

## 2012-07-18 DIAGNOSIS — R4182 Altered mental status, unspecified: Secondary | ICD-10-CM

## 2012-07-18 DIAGNOSIS — G40909 Epilepsy, unspecified, not intractable, without status epilepticus: Secondary | ICD-10-CM

## 2012-07-18 MED ORDER — ASPIRIN 81 MG PO CHEW
81.0000 mg | CHEWABLE_TABLET | Freq: Once | ORAL | Status: AC
  Administered 2012-07-18: 81 mg via ORAL
  Filled 2012-07-18: qty 1

## 2012-07-18 MED ORDER — CLOPIDOGREL BISULFATE 75 MG PO TABS: 75.0000 mg | ORAL_TABLET | Freq: Every day | ORAL | Status: DC

## 2012-07-18 NOTE — Evaluation (Signed)
Physical Therapy Evaluation Patient Details Name: Noah Lee MRN: [id number] DOB: 05-02-1946 Today's Date: 07/18/2012 Time: 1206-1231 PT Time Calculation (min): 25 min  PT Assessment / Plan / Recommendation Clinical Impression  Pt s/p TIA with PMH of HTN aneurysm s/p coiling, and CVAx2.  Presents today stating bilat UE tingling and pain have all resolved and he is very tired.  Pt demonstrates some cognitive difficulties as well as mobility, ambulation, and activity tolerance.  Will benefit from skilled PT services to maximize mobility and independence in the acute setting.  Recommend HHPT at this time.  Discussed with pt who agrees.     PT Assessment  Patient needs continued PT services    Follow Up Recommendations  Home health PT;Supervision/Assistance - 24 hour       Barriers to Discharge Decreased caregiver support      Equipment Recommendations  Rolling walker with 5" wheels;Other (comment) (Pt states he would like to get grab bars in his bathroom)       Frequency Min 4X/week    Precautions / Restrictions Precautions Precautions: Fall Restrictions Weight Bearing Restrictions: No   Pertinent Vitals/Pain VSS, no pain.      Mobility  Bed Mobility Bed Mobility: Supine to Sit;Sitting - Scoot to Edge of Bed Supine to Sit: 4: Min assist;With rails;HOB flat Sitting - Scoot to [place] of Bed: 5: Supervision Details for Bed Mobility Assistance: req min A to achieve sitting EOB Transfers Transfers: Sit to Stand;Stand to Sit Sit to Stand: 1: +2 Total assist;With upper extremity assist;From bed Sit to Stand: Patient Percentage: 60% Stand to Sit: 1: +2 Total assist;With upper extremity assist;To bed;To chair/3-in-1 Stand to Sit: Patient Percentage: 70% Details for Transfer Assistance: req A for R knee buckling upon standing, vc for hand placement and sequencing Ambulation/Gait Ambulation/Gait Assistance: 1: +2 Total assist Ambulation/Gait: Patient Percentage:  70% Ambulation Distance (Feet): 15 Feet Assistive device: Rolling walker Ambulation/Gait Assistance Details: mild R knee unsteadiness with abmulation, vc for safe use of DME, pt liked to pick up RW when turning around and pulled on RW to stand up; walked forward 30ft and took three side steps to left to sit down.  Gait Pattern: Step-to pattern;Decreased step length - right;Decreased step length - left;Decreased stride length;Shuffle;Trunk flexed Gait velocity: decr General Gait Details: impulsive and unaware of need to use RW close to body as well as req postural cueing; reports dizziness, may need compensation exercises to decr dizziness for safe ambulation Stairs: No Wheelchair Mobility Wheelchair Mobility: No Modified Rankin (Stroke Patients Only) Pre-Morbid Rankin Score: Slight disability Modified Rankin: Moderately severe disability           PT Diagnosis: Difficulty walking  PT Problem List: Decreased strength;Decreased range of motion;Decreased activity tolerance;Decreased balance;Decreased mobility;Decreased coordination;Decreased cognition;Decreased knowledge of use of DME;Decreased safety awareness;Decreased knowledge of precautions PT Treatment Interventions: DME instruction;Gait training;Functional mobility training;Therapeutic activities;Therapeutic exercise;Stair training;Balance training;Neuromuscular re-education;Cognitive remediation   PT Goals Acute Rehab PT Goals PT Goal Formulation: With patient Time For Goal Achievement: 08/01/12 Potential to Achieve Goals: Good Pt will go Supine/Side to Sit: Independently PT Goal: Supine/Side to Sit - Progress: Goal set today Pt will go Sit to Stand: with modified independence PT Goal: Sit to Stand - Progress: Goal set today Pt will go Stand to Sit: with modified independence PT Goal: Stand to Sit - Progress: Goal set today Pt will Stand: with modified independence;6 - 10 min;with bilateral upper extremity support PT Goal: Stand  - Progress: Goal set today Pt will  Ambulate: 51 - 150 feet;with modified independence;with least restrictive assistive device PT Goal: Ambulate - Progress: Goal set today Pt will Go Up / Down Stairs: 1-2 stairs;with min assist PT Goal: Up/Down Stairs - Progress: Goal set today  Visit Information  Last PT Received On: 07/18/12 Assistance Needed: +1    Subjective Data  Subjective: I was deep in sleep, I've fallen asleep three times and three times I've been woken up.  Patient Stated Goal: to go home   Prior Functioning  Home Living Lives With: Daughter;Other (Comment) (and daughters boyfriend) Available Help at Discharge: Family;Available PRN/intermittently (may need to communicate with daughter for clarification on available help at d/c) Type of Home: Apartment Home Access: Stairs to enter Entrance Stairs-Number of Steps: 4 Entrance Stairs-Rails: Right Home Layout: One level Bathroom Shower/Tub: Engineer, manufacturing systems: Standard Bathroom Accessibility: Yes How Accessible: Accessible via walker Home Adaptive Equipment: Straight cane Additional Comments: Assistance at home is unclear as pt did not specify who could help him stating: "Well my daughter will be home for the next week because she is 7 months pregnant, I'll be her guard. Then [place] farm with her."  This statement was unclear and I'm questioning cognitive decline or slight dementia Prior Function Level of Independence: Independent with assistive device(s) Able to Take Stairs?: Yes Driving: Yes (states he is not suppose to drive but does anyway ) Vocation: Retired Musician: No difficulties Dominant Hand: Right    Cognition  Overall Cognitive Status: Impaired Area of Impairment: Attention;Memory;Safety/judgement;Awareness of errors;Awareness of deficits Arousal/Alertness: Awake/alert Orientation Level: Oriented X4 / Intact Behavior During Session: [facility] for tasks performed Current  Attention Level: Selective Attention - Other Comments: inconsistant attention level, followed commands but lethargic and difficulty staying awake at begining of session Memory Deficits: poor historian Safety/Judgement: Impulsive;Decreased safety judgement for tasks assessed Safety/Judgement - Other Comments: pt moves quickly and is unsteady, states he would sit down if he is dizzy, however question how safe he could be on his own Awareness of Errors: Assistance required to identify errors made Cognition - Other Comments: Pt with apparent cognitive difficulties, had trouble understanding level of college and why I was asking about help at home.     Extremity/Trunk Assessment Right Upper Extremity Assessment RUE ROM/Strength/Tone: Within functional levels RUE Sensation: WFL - Light Touch RUE Coordination: WFL - gross/fine motor Left Upper Extremity Assessment LUE ROM/Strength/Tone: WFL for tasks assessed LUE Sensation: WFL - Light Touch LUE Coordination: WFL - gross/fine motor Right Lower Extremity Assessment RLE ROM/Strength/Tone: Deficits RLE ROM/Strength/Tone Deficits: 4/5; previous Rknee surgery, no apparent difference bilat. RLE Sensation: WFL - Light Touch Left Lower Extremity Assessment LLE ROM/Strength/Tone: Deficits LLE ROM/Strength/Tone Deficits: 4/5 LLE Sensation: WFL - Light Touch Trunk Assessment Trunk Assessment: Normal   Balance Balance Balance Assessed: Yes Static Sitting Balance Static Sitting - Balance Support: Bilateral upper extremity supported;Feet supported Static Sitting - Level of Assistance: 7: Independent Static Sitting - Comment/# of Minutes: EOB no difficulties Static Standing Balance Static Standing - Balance Support: Bilateral upper extremity supported;During functional activity Static Standing - Level of Assistance: 3: Mod assist Static Standing - Comment/# of Minutes: unsteady on feet, 5-79min, req A for posture and RLE stability  End of Session  PT - End of Session Equipment Utilized During Treatment: Gait belt Activity Tolerance: Patient limited by fatigue Patient left: in chair;with call bell/phone within reach Nurse Communication: Mobility status  GP Functional Limitation: Mobility: Walking and moving around Mobility: Walking and Moving Around  Current Status [phone]): At least 40 percent but less than 60 percent impaired, limited or restricted Mobility: Walking and Moving Around Goal Status [phone]): At least 1 percent but less than 20 percent impaired, limited or restricted   Syanne Looney 07/18/2012, 3:32 PM Sharion Balloon, SPT Acute Rehab Services [phone]

## 2012-07-18 NOTE — Progress Notes (Signed)
Family Medicine Teaching Service Daily Progress Note Service Page: [phone]  Patient Assessment: 67 y.o. year old male with PMH of HTN aneurysm s/p coiling, and CVA times 2 presenting presenting with a TIA.   Subjective: Pt seen at bedside. Reports he feels "fair to middlin'," but that numbness and tingling of hands is "completely gone." Confusion also seems resolved. Some anxiety over MRI due to claustrophobia. Headache overnight which has now resolved; no visual changes. Denies chest pain, SOB, or pain anywhere. Anxious to go home, but comfortable staying to complete workup.   Objective: Temp:  [97.7 F (36.5 C)-98.1 F (36.7 C)] 98.1 F (36.7 C) (01/01 0600) Pulse Rate:  [67-84] 67  (01/01 0600) Resp:  [18] 18  (01/01 0600) BP: (107-136)/(43-77) 136/65 mmHg (01/01 0600) SpO2:  [94 %-97 %] 97 % (01/01 0600) Exam: Gen: elderly male in NAD, lying in bed able to move without assistance; alert, pleasant and cooperative with exam CV: RRR, good S1/S2, no murmur appreciated Resp: CTABL, no wheezes, non-labored with good air movement bilaterally Abd: soft, nontender, nondistended, BS + Ext: No LE edema, distal pulses 2+ and symmetric; longitudinal scar over R knee  Neuro: A&Ox3, strength 5/5 in BL upper and lower extremities; sensation intact in all four extremities, no other gross focal deficit  I have reviewed the patient's medications, labs, imaging, and diagnostic testing.  Notable results are summarized below.  CBC BMET   Lab 07/16/12 1530  WBC 5.9  HGB 12.5*  HCT 38.0*  PLT 225    Lab 07/16/12 1530  NA 140  K 3.6  CL 103  CO2 27  BUN 9  CREATININE 0.86  GLUCOSE 144*  CALCIUM 8.7    UDS 12/30: all negative Lipid panel 12/31  Total cholesterol 144  TG 116  HDL 58  LDL 63   Imaging/Diagnostic Tests: 07/16/12:  CT head: Negative for acute findings CXR: thoracic spondylosis and no acute findings 12/31: EEG: normal without seizure or seizure  predisposition   Plan: DANTRELL SCHERTZER is a 67 y.o. year old male with PMH of HTN aneurysm s/p coiling, and CVA times 2 presenting presenting with a TIA. Admitted 12/30.  TIA with Hx of aneurysm s/p coiliing procedure - symptoms continue to Improve; appear subjectively completely resolved  - ABCD2 Score of 5  - TPA contraindicated with previous hemmorhage  - Recent work up In 12/2011 at [facility]   Carotid dopplers - no stenotic flow or critical diameters   MRI head - multiple infarcts (new and old) and evidence of subarachnoid hemorrhage (old).    TTE - EF 50%   CT angio neck/head - no significant occlusive or stenotic extra or intracranial disease.   Plan:    - Neurology assistance appreciated; ASA 81 mg started, will f/u MRI/MRA results    - continue tele   - will follow up PT/OT reccs   - continue Lipitor  Seizure d/o   - Stable, EEG normal and not suggestive of current seizure activity  - UDS negative for substances   - Continue home dose of Lamictal    HTN   - Well controlled on home meds  - Continue  amlodipine 10 daily and HCTZ 25 daily  Chronic Knee pain   - S/p R knee arthroplasty and long Hx of arthritis causing difficulty to walk   - Up with assistance   - Home dose of oxycodone- 5 mg PRN daily   - Follow up PT/OT recommendations   Urinary incontinence  -  At baseline   - Will monitor and continue Proscar   FEN/GI: heart healthy diet Prophylaxis: Lovenox  Disposition: Home pending work up and improvement  Code Status: Full  Aileene Lanum, Corporate treasurer, MD 07/18/2012, 9:53 AM

## 2012-07-18 NOTE — Progress Notes (Signed)
FMTS Attending Daily Note: Denny Levy MD [phone] pager office [phone] I  have seen and examined this patient, reviewed their chart. I have discussed this patient with the resident. I agree with the resident's findings, assessment and care plan.Appreciate neurology consult. Given his symptoms of B UE numbness will add MRI cervical spine to his already scheduled MRI head to r/o syrinx of cord which can cause caput like symptoms of upper extremity.

## 2012-07-18 NOTE — Evaluation (Signed)
Occupational Therapy Evaluation Patient Details Name: Noah Lee MRN: [id number] DOB: 07-May-1946 Today's Date: 07/18/2012 Time: 1478-2956 OT Time Calculation (min): 18 min  OT Assessment / Plan / Recommendation Clinical Impression  67 yo male s/p  TIA with PMH of HTN aneurysm s/p coiling, and CVAx2. Ot to sign off recommend HHOT for balance    OT Assessment  All further OT needs can be met in the next venue of care    Follow Up Recommendations  Home health OT    Barriers to Discharge      Equipment Recommendations  None recommended by OT    Recommendations for Other Services    Frequency       Precautions / Restrictions Precautions Precautions: Fall Restrictions Weight Bearing Restrictions: No   Pertinent Vitals/Pain Reports feeling sleepy     ADL  Grooming: Wash/dry face;Teeth care;Supervision/safety (needs min v/c for redirection to task pt attempting to stop) Where Assessed - Grooming: Unsupported standing Toilet Transfer: Modified independent Toilet Transfer Method: Sit to stand Toilet Transfer Equipment: Regular height toilet Toileting - Clothing Manipulation and Hygiene: Modified independent Where Assessed - Toileting Clothing Manipulation and Hygiene: Sit to stand from 3-in-1 or toilet Equipment Used: Rolling walker;Gait belt Transfers/Ambulation Related to ADLs: pt ambulating to restroom with RW and pt states" i do this without a walker at home" Pt ambulating to sink and then chair without Rw in a slow guard pace but without LOB. Pt then requesting a RW from therapist for d/c home. Pt states "i had all this stuff from the [place] but its gone now" Pt is a fall risk due to cognitive deficits noted ADL Comments: Pt agreeable to OOB with sink level task. pt states "i have been sleeping all day I just have to wake up" Pt terminating grooming task after requesting to brush teeht. Pt redirected to oral care . pt states "oh yeah I do that sometimes." Pt reports being at  baseline and no changes. Pt with normal sensation in BIL UE and LE per patient report during testing this session. Pt agreeable to hhot for balance    OT Diagnosis:    OT Problem List:   OT Treatment Interventions:     OT Goals    Visit Information  Last OT Received On: 07/18/12 Assistance Needed: +1    Subjective Data  Subjective: "my balance has been bad since my knee surgeries" Patient Stated Goal: to return home and move in with daughter in February   Prior Functioning     Home Living Lives With: Daughter;Other (Comment) Available Help at Discharge: Family;Available PRN/intermittently Type of Home: Apartment Home Access: Stairs to enter Entrance Stairs-Number of Steps: 4 Entrance Stairs-Rails: Right Home Layout: One level Bathroom Shower/Tub: Engineer, manufacturing systems: Standard Bathroom Accessibility: Yes How Accessible: Accessible via walker Home Adaptive Equipment: Straight cane Additional Comments: Assistance at home is unclear as pt did not specify who could help him stating: "Well my daughter will be home for the next week because she is 7 months pregnant, I'll be her guard. Then I'm moving to the farm with her."  This statement was unclear and I'm questioning cognitive decline or slight dementia Prior Function Level of Independence: Independent with assistive device(s) Able to Take Stairs?: Yes Driving: Yes Vocation: Retired Musician: No difficulties Dominant Hand: Right         Vision/Perception     Cognition  Overall Cognitive Status: Impaired Area of Impairment: Attention;Memory;Safety/judgement;Awareness of errors;Awareness of deficits Arousal/Alertness: Awake/alert Orientation Level:  Oriented X4 / Intact Behavior During Session: [facility] for tasks performed Current Attention Level: Selective Attention - Other Comments: inconsistant attention level, followed commands but lethargic and difficulty staying awake at begining of  session Memory Deficits: poor historian Safety/Judgement: Impulsive;Decreased safety judgement for tasks assessed Safety/Judgement - Other Comments: pt moves quickly and is unsteady, states he would sit down if he is dizzy, however question how safe he could be on his own Awareness of Errors: Assistance required to identify errors made Cognition - Other Comments: Pt with apparent cognitive difficulties, had trouble understanding level of college and why I was asking about help at home.     Extremity/Trunk Assessment Right Upper Extremity Assessment RUE ROM/Strength/Tone: Within functional levels RUE Sensation: WFL - Light Touch RUE Coordination: WFL - gross/fine motor Left Upper Extremity Assessment LUE ROM/Strength/Tone: WFL for tasks assessed LUE Sensation: WFL - Light Touch LUE Coordination: WFL - gross/fine motor  Trunk Assessment Trunk Assessment: Normal     Mobility Bed Mobility Bed Mobility: Supine to Sit;Sitting - Scoot to Edge of Bed Supine to Sit: 6: Modified independent (Device/Increase time);HOB flat Sitting - Scoot to Edge of Bed: 6: Modified independent (Device/Increase time) Details for Bed Mobility Assistance: required extra time Transfers Transfers: Sit to Stand;Stand to Sit Sit to Stand: 5: Supervision;With upper extremity assist;From bed  Stand to Sit: 5: Supervision;With upper extremity assist;To chair/3-in-1  Details for Transfer Assistance: pt demonstrates improved mobility since PT session . pt moving without DME and no buckling noted. Pt reports being at baseline     Shoulder Instructions     Exercise     Balance Balance Balance Assessed: Yes Static Sitting Balance Static Sitting - Balance Support: Bilateral upper extremity supported;Feet supported Static Sitting - Level of Assistance: 7: Independent Static Sitting - Comment/# of Minutes: EOB no difficulties Static Standing Balance Static Standing - Balance Support: Bilateral upper extremity  unsupported;During functional activity Static Standing - Level of Assistance: 3: supervision assist Static Standing - Comment/# of Minutes 5-10min,   End of Session OT - End of Session Activity Tolerance: Patient tolerated treatment well Patient left: in chair;with call bell/phone within reach Nurse Communication: Mobility status;Precautions  GO Functional Assessment Tool Used: clinical judgement Functional Limitation: Self care Self Care Current Status (Z6109): At least 1 percent but less than 20 percent impaired, limited or restricted Self Care Goal Status (U0454): At least 1 percent but less than 20 percent impaired, limited or restricted Self Care Discharge Status [phone]): At least 1 percent but less than 20 percent impaired, limited or restricted   Lucile Shutters 07/18/2012, 3:32 PM Pager: [phone]

## 2012-07-18 NOTE — Progress Notes (Signed)
Subjective: Patient reports no symptoms this morning.  No longer with nay numbness.  Headache completely resolved as well.  Did not develop visual symptoms at any point during this episode of headache.      Objective: Current vital signs: BP 136/65  Pulse 67  Temp 98.1 F (36.7 C) (Oral)  Resp 18  Ht 5\' 11"  (1.803 m)  Wt 122.471 kg (270 lb)  BMI 37.66 kg/m2  SpO2 97% Vital signs in last 24 hours: Temp:  [97.7 F (36.5 C)-98.1 F (36.7 C)] 98.1 F (36.7 C) (01/01 0600) Pulse Rate:  [67-84] 67  (01/01 0600) Resp:  [18] 18  (01/01 0600) BP: (107-136)/(43-77) 136/65 mmHg (01/01 0600) SpO2:  [94 %-97 %] 97 % (01/01 0600)  Intake/Output from previous day: 12/31 0701 - 01/01 0700 In: -  Out: 1000 [Urine:1000] Intake/Output this shift:   Nutritional status: Dysphagia  Neurologic Exam: Mental Status: Alert, oriented, thought content appropriate.  Speech fluent without evidence of aphasia.  Able to follow 3 step commands without difficulty. Cranial Nerves: II: Discs flat bilaterally; Visual fields grossly normal, pupils equal, round, reactive to light and accommodation III,IV, VI: ptosis not present, extra-ocular motions intact bilaterally V,VII: decrease in right NLF, facial light touch sensation normal bilaterally VIII: hearing normal bilaterally IX,X: gag reflex present XI: bilateral shoulder shrug XII: midline tongue extension Motor: Right : Upper extremity   5/5    Left:     Upper extremity   5/5  Lower extremity   5/5     Lower extremity   5/5 Tone and bulk:normal tone throughout; no atrophy noted Sensory: Pinprick and light touch intact throughout, bilaterally Deep Tendon Reflexes: 2+ and symmetric with absent AJ's bilaterally Plantars: Right: downgoing   Left: downgoing Cerebellar: normal finger-to-nose and normal heel-to-shin test  Lab Results: Basic Metabolic Panel:  Lab 07/16/12 1610  NA 140  K 3.6  CL 103  CO2 27  GLUCOSE 144*  BUN 9  CREATININE 0.86    CALCIUM 8.7  MG --  PHOS --    Liver Function Tests: No results found for this basename: AST:5,ALT:5,ALKPHOS:5,BILITOT:5,PROT:5,ALBUMIN:5 in the last 168 hours No results found for this basename: LIPASE:5,AMYLASE:5 in the last 168 hours No results found for this basename: AMMONIA:3 in the last 168 hours  CBC:  Lab 07/16/12 1530  WBC 5.9  NEUTROABS 4.2  HGB 12.5*  HCT 38.0*  MCV 94.3  PLT 225    Cardiac Enzymes: No results found for this basename: CKTOTAL:5,CKMB:5,CKMBINDEX:5,TROPONINI:5 in the last 168 hours  Lipid Panel:  Lab 07/17/12 0538  CHOL 144  TRIG 116  HDL 58  CHOLHDL 2.5  VLDL 23  LDLCALC 63    CBG:  Lab 07/16/12 2042  GLUCAP 93    Microbiology: No results found for this or any previous visit.  Coagulation Studies:  Basename 07/16/12 1530  LABPROT 12.7  INR 0.96    Imaging: Dg Chest 2 View  07/16/2012  *RADIOLOGY REPORT*  Clinical Data: Dizziness.  Facial injury.  Altered mental status.  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  Thoracic spondylosis noted.  The lungs appear clear.  No pleural effusion observed.  IMPRESSION:  1.  Thoracic spondylosis.  No acute findings.   Original Report Authenticated By: Gaylyn Rong, M.D.    Ct Head Wo Contrast  07/16/2012  *RADIOLOGY REPORT*  Clinical Data: Dizziness and falling.  Trauma to face.  CT HEAD  WITHOUT CONTRAST  Technique:  Contiguous axial images were obtained from the base of the skull through the vertex without contrast.  Comparison: 01/02/2012  Findings: Bone windows demonstrate near complete opacification of the left greater than right maxillary sinuses.  Moderate ethmoid and mild sphenoid sinus mucosal thickening.  Minimal frontal sinus mucosal thickening. Clear mastoid air cells.  Soft tissue windows demonstrate advanced for age cerebral atrophy and small vessel ischemic change.  Prior aneurysm  coiling in the region of the left side of the circle of Willis.  This results in mild beam hardening artifact. No  mass lesion, hemorrhage, hydrocephalus, acute infarct, intra-axial, or extra-axial fluid collection.  IMPRESSION:  1. No acute intracranial abnormality. 2.  Age advanced cerebral atrophy and small vessel ischemic change. 3.  Advanced sinus disease.   Original Report Authenticated By: Jeronimo Greaves, M.D.     Medications:  I have reviewed the patient's current medications. Scheduled:   . amLODipine  10 mg Oral Daily  . atorvastatin  10 mg Oral QHS  . enoxaparin (LOVENOX) injection  40 mg Subcutaneous Q24H  . finasteride  5 mg Oral QHS  . folic acid  1 mg Oral Daily  . hydrochlorothiazide  25 mg Oral Daily  . lamoTRIgine  200 mg Oral Daily  . multivitamin with minerals  1 tablet Oral Daily  . pramipexole  0.25 mg Oral QHS  . thiamine  100 mg Oral Daily    Assessment/Plan:  Patient Active Hospital Problem List: Seizure disorder (07/17/2012)   Assessment: Per patient typical presentation for seizure not present.  EEG reviewed and unremarkable.  Patient on Lamictal and compliant per his report.     Plan:  1.  Continue Lamictal at current dose Headache and BUE numbness   Assessment: Likely a migraine phenomenon.  Patient back to baseline.  MRI of the brain pending   Plan:  1.  Will follow up MRI results.  Patient back to baseline.  If no acute findings, no further neurologic work up recommended.     LOS: 2 days   Thana Farr, MD [facility] [phone] 07/18/2012  9:28 AM

## 2012-07-19 ENCOUNTER — Observation Stay (HOSPITAL_COMMUNITY): Payer: Self-pay

## 2012-07-19 MED ORDER — LORAZEPAM 2 MG/ML IJ SOLN
INTRAMUSCULAR | Status: AC
  Filled 2012-07-19: qty 1

## 2012-07-19 MED ORDER — ASPIRIN 81 MG PO TBEC: 81.0000 mg | DELAYED_RELEASE_TABLET | Freq: Every day | ORAL | Status: DC

## 2012-07-19 MED ORDER — ASPIRIN EC 81 MG PO TBEC
81.0000 mg | DELAYED_RELEASE_TABLET | Freq: Every day | ORAL | Status: DC
  Administered 2012-07-19 – 2012-07-20 (×2): 81 mg via ORAL
  Filled 2012-07-19 (×2): qty 1

## 2012-07-19 NOTE — Progress Notes (Signed)
Physical Therapy Treatment Patient Details Name: Noah Lee MRN: [id number] DOB: [birth year] Today's Date: 07/19/2012 Time: 4098-1191 PT Time Calculation (min): 14 min  PT Assessment / Plan / Recommendation Comments on Treatment Session  Pt with significant improvements since PT evaluation. Discussed multiple falls at home and pt equates with dizziness. Discussed use of RW at home to decrease risk of falls. Will complete balance test next session to determine safety at home    Follow Up Recommendations  Supervision/Assistance - 24 hour;No PT follow up     Does the patient have the potential to tolerate intense rehabilitation     Barriers to Discharge        Equipment Recommendations  Rolling walker with 5" wheels;Other (comment)    Recommendations for Other Services    Frequency Min 4X/week   Plan Discharge plan remains appropriate;Frequency remains appropriate    Precautions / Restrictions Precautions Precautions: Fall Restrictions Weight Bearing Restrictions: No   Pertinent Vitals/Pain Pt with no complaints of pain    Mobility  Bed Mobility Bed Mobility: Sit to Supine;Supine to Sit Supine to Sit: 6: Modified independent (Device/Increase time) Sitting - Scoot to Edge of Bed: 6: Modified independent (Device/Increase time) Transfers Transfers: Sit to Stand;Stand to Sit Sit to Stand: 5: Supervision;With upper extremity assist;From bed Stand to Sit: 5: Supervision;With upper extremity assist;To chair/3-in-1 Details for Transfer Assistance: Supervision for safety only Ambulation/Gait Ambulation/Gait Assistance: 5: Supervision Ambulation Distance (Feet): 200 Feet Assistive device: Rolling walker Ambulation/Gait Assistance Details: VC for safe distance to RW. No LOB or unsteadiness. Discussed use of RW as pt has had multiple falls at home Gait Pattern: Within Functional Limits Gait velocity: decr Stairs: Yes Stairs Assistance: 5: Supervision Stairs Assistance Details  (indicate cue type and reason): Supervision for safety only Stair Management Technique: Two rails;Step to pattern;Forwards Number of Stairs: 5  Modified Rankin (Stroke Patients Only) Modified Rankin: Moderate disability    Exercises     PT Diagnosis:    PT Problem List:   PT Treatment Interventions:     PT Goals Acute Rehab PT Goals PT Goal: Supine/Side to Sit - Progress: Progressing toward goal PT Goal: Sit to Stand - Progress: Progressing toward goal PT Goal: Stand to Sit - Progress: Progressing toward goal PT Goal: Stand - Progress: Progressing toward goal PT Goal: Ambulate - Progress: Progressing toward goal PT Goal: Up/Down Stairs - Progress: Met Additional Goals Additional Goal #1: Pt will score >19 on the DGI to determine fall risk at home PT Goal: Additional Goal #1 - Progress: Goal set today  Visit Information  Last PT Received On: 07/19/12 Assistance Needed: +1    Subjective Data      Cognition  Overall Cognitive Status: Appears within functional limits for tasks assessed/performed Arousal/Alertness: Awake/alert Orientation Level: Appears intact for tasks assessed Behavior During Session: [facility] for tasks performed    Balance     End of Session PT - End of Session Equipment Utilized During Treatment: Gait belt Activity Tolerance: Patient tolerated treatment well Patient left: in bed;with call bell/phone within reach;with bed alarm set Nurse Communication: Mobility status   GP     Milana Kidney 07/19/2012, 5:13 PM

## 2012-07-19 NOTE — Progress Notes (Signed)
Family Medicine Teaching Service Daily Progress Note Service Page: [phone]  Patient Assessment: 67 y.o. year old male with PMH of HTN aneurysm s/p coiling, and CVA times 2 presenting presenting with a TIA.   Subjective: Pt seen at bedside. States he feels very well today and slept well last night after receiving Ativan; states he will "talk to his psychiatrist about maybe getting it prescribed," since it helped him sleep so well and because he has taken Ambien in the past that "made him crazy." No further upper extremity numbness/tingling, denies pain, SOB, fever, N/V.  Objective: Temp:  [97.7 F (36.5 C)-98.3 F (36.8 C)] 97.7 F (36.5 C) (01/02 0600) Pulse Rate:  [70-83] 74  (01/02 0600) Resp:  [18-20] 18  (01/02 0600) BP: (100-137)/(63-76) 122/72 mmHg (01/02 0600) SpO2:  [94 %-98 %] 96 % (01/02 0600) Exam: Gen: elderly male in NAD, sitting up in bed, able to move without assistance; alert, pleasant and cooperative CV: RRR, good S1/S2, no murmur appreciated Resp: CTABL, no wheezes, non-labored with good bilateral air movement Abd: soft, nontender, nondistended, BS + Ext: No LE edema, distal pulses 2+ and symmetric; longitudinal scar over R knee  Neuro: A&Ox3, strength 5/5 in BL upper and lower extremities; sensation intact in all four extremities, no other gross focal deficit  Laboratory: CBC BMET   Lab 07/16/12 1530  WBC 5.9  HGB 12.5*  HCT 38.0*  PLT 225    Lab 07/16/12 1530  NA 140  K 3.6  CL 103  CO2 27  BUN 9  CREATININE 0.86  GLUCOSE 144*  CALCIUM 8.7    UDS 12/30: all negative Lipid panel 12/31  Total cholesterol 144  TG 116  HDL 58  LDL 63   Imaging/Diagnostic Tests: 07/16/12:  CT head: Negative for acute findings CXR: thoracic spondylosis and no acute findings 12/31: EEG: normal without seizure or seizure predisposition   Plan: HOOVER GREWE is a 67 y.o. year old male with PMH of HTN aneurysm s/p coiling, and CVA times 2 presenting  presenting with a TIA. Admitted 12/30.  #TIA with Hx of aneurysm s/p coiliing procedure - symptoms continue to Improve; appear subjectively completely resolved  - ABCD2 Score of 5  - TPA contraindicated with previous hemmorhage  - Recent work up In 12/2011 at [facility]   Carotid dopplers - no stenotic flow or critical diameters   MRI head - multiple infarcts (new and old) and evidence of subarachnoid hemorrhage (old).    TTE - EF 50%   CT angio neck/head - no significant occlusive or stenotic extra or intracranial disease.   Plan:    - Neurology assistance appreciated; ASA 81 mg started 1/1    - continue tele   - will f/u MRI/MRA results; neck as well as head to evaluate for syrinx   - Home health PT/OT ordered per recommendations   - continue Lipitor  #Seizure d/o   - Stable, EEG normal and not suggestive of current seizure activity  - UDS negative for substances   - Continue home dose of Lamictal    #HTN   - Well controlled on home meds  - Continue  amlodipine 10 daily and HCTZ 25 daily  #Chronic Knee pain   - S/p R knee arthroplasty and long Hx of arthritis causing difficulty to walk   - Up with assistance   - Home dose of oxycodone- 5 mg PRN daily   - Home health PT/OT, as above  #Urinary incontinence  - At  baseline   - Will monitor and continue Proscar   FEN/GI: heart healthy diet Prophylaxis: Lovenox  Disposition: Home pending work up and improvement; likely discharge home today, after MRI; home health and case management consult ordered/requested, assistance appreciated Code Status: Full  Ayush Boulet, Cristal Deer, MD 07/19/2012, 9:29 AM

## 2012-07-19 NOTE — Progress Notes (Signed)
CARE MANAGEMENT NOTE 07/19/2012  Patient:  BEXLEY, MCLESTER   Account Number:  [id number]  Date Initiated:  07/17/2012  Documentation initiated by:  [facility]  Subjective/Objective Assessment:   Admitted for CVA workup     Action/Plan:   PT/OT evals   Anticipated DC Date:  07/19/2012   Anticipated DC Plan:  HOME W HOME HEALTH SERVICES      DC Planning Services  CM consult      [facility] Choice  HOME HEALTH   Choice offered to / List presented to:          [facility] arranged  HH-1 RN  HH-2 PT      Status of service:  Completed, signed off Medicare Important Message given?   (If response is "NO", the following Medicare IM given date fields will be blank) Date Medicare IM given:   Date Additional Medicare IM given:    Discharge Disposition:    Per UR Regulation:  Reviewed for med. necessity/level of care/duration of stay  If discussed at Long Length of Stay Meetings, dates discussed:    Comments:  07/19/12 Vance Peper, RB BSN Case Manager [phone] Patient will receive [facility] visits from [facility], is is uninsured so will not have weekly therapy visits.

## 2012-07-19 NOTE — Progress Notes (Signed)
FMTS Attending Daily Note: Noah Polyak MD [phone] pager office [phone] I have discussed this patient with the resident and reviewed the assessment and plan as documented above. I agree wit the resident's findings and plan.  

## 2012-07-20 NOTE — Progress Notes (Signed)
FMTS Attending Daily Note: Noah Seawood MD [phone] pager office [phone] I  have seen and examined this patient, reviewed their chart. I have discussed this patient with the resident. I agree with the resident's findings, assessment and care plan. 

## 2012-07-20 NOTE — Care Management Note (Signed)
    Page 1 of 1   07/20/2012     2:52:42 PM   CARE MANAGEMENT NOTE 07/20/2012  Patient:  DAMARIS, GEERS   Account Number:  [id number]  Date Initiated:  07/17/2012  Documentation initiated by:  [facility]  Subjective/Objective Assessment:   Admitted for CVA workup     Action/Plan:   PT/OT evals-no PT follow up recommended, recommended rolling walker   Anticipated DC Date:  07/20/2012   Anticipated DC Plan:  HOME/SELF CARE      DC Planning Services  CM consult      [facility] Choice  HOME HEALTH   Choice offered to / List presented to:             Status of service:  Completed, signed off Medicare Important Message given?   (If response is "NO", the following Medicare IM given date fields will be blank) Date Medicare IM given:   Date Additional Medicare IM given:    Discharge Disposition:  HOME/SELF CARE  Per UR Regulation:  Reviewed for med. necessity/level of care/duration of stay  If discussed at Long Length of Stay Meetings, dates discussed:    Comments:  07/21/11 PT no longer recommending HHPT, spoke with patient about a walker, he stated he would rather get it from the [place] at no charge than get one from hospital. Spoke wit Dr. Donnetta Simpers, no [facility] needed. Contacted Mase Dhondt at [facility] and cancelled [facility]. Jacquelynn Cree RN, BSN, CCM  07/19/12 Vance Peper, RB BSN Case Manager [phone] Patient will receive [facility] visits from [facility], is is uninsured so will not have weekly therapy visits.

## 2012-07-20 NOTE — Discharge Summary (Signed)
Family Medicine Teaching [facility] Discharge Summary  Patient name: Noah Lee Medical record number: [id number] Date of birth: May 23, 1946 Age: 67 y.o. Gender: male Date of Admission: 07/16/2012  Date of Discharge: 07/20/2012 Admitting Physician: Barbaraann Barthel, MD  Primary Care Provider: No primary provider on file.  Indication for Hospitalization: confusion, bilateral upper extremity numbness Discharge Diagnoses:  TIA Seizure disorder HTN BPH  Consultations: neurology   Significant Labs and Imaging:   07/16/2012 15:30  WBC 5.9  RBC 4.03 (L)  Hemoglobin 12.5 (L)  HCT 38.0 (L)  MCV 94.3  MCH 31.0  MCHC 32.9  RDW 13.2  Platelets 225    07/16/2012 15:30  Sodium 140  Potassium 3.6  Chloride 103  CO2 27  BUN 9  Creatinine 0.86  Calcium 8.7  GFR calc non Af Amer 89 (L)  GFR calc Af Amer >90  Glucose 144 (H)   UDS 12/30: all negative  Lipid panel 12/31   Total cholesterol 144   TG 116   HDL 58   LDL 63  Imaging: CXR, 12/30 @1604  Findings: 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 Thoracic spondylosis noted.  The lungs appear clear. No pleural effusion observed.  IMPRESSION:  1. Thoracic spondylosis. No acute findings.  CT, Head, 12/30 @1618  Findings: Bone windows demonstrate near complete opacification of  the left greater than right maxillary sinuses. Moderate ethmoid  and mild sphenoid sinus mucosal thickening. Minimal frontal sinus  mucosal thickening. Clear mastoid air cells.  Soft tissue windows demonstrate advanced for age cerebral atrophy  and small vessel ischemic change. Prior aneurysm coiling in the  region of the left side of the circle of Willis. This results in  mild beam hardening artifact. No mass lesion, hemorrhage,  hydrocephalus, acute infarct, intra-axial, or extra-axial fluid  collection.  IMPRESSION:  1. No acute intracranial abnormality.  2. Age advanced cerebral atrophy and  small vessel ischemic change.  3. Advanced sinus disease.  Procedures: EEG, 12/31 History: 67 yo M with intermittent episodes of bilateral arm tingling, confusion.  Sedation: None  Background: There is a well defined posterior dominant rhythm of 9 Hz that attenuates with eye opening. No sleep or drowsiness was recorded.  Photic stimulation: Physiologic driving is not performed.  EEG Diagnosis: Normal EEG  Clinical Interpretation: This normal EEG is recorded in the waking state. There was no seizure or seizure predisposition recorded on this study.  Ready by Ritta Slot, MD   Brief Hospital Course: Noah Lee is a 67 y.o. year old male with PMH of HTN aneurysm s/p coiling, and CVA times 2 presenting presenting with a TIA (symptoms of confusion, transient dizziness, and numbness/tingling of bilateral arms from shoulders to fingertips). Admitted 12/30. Neurology was consulted with work-up detailed below. At time of discharge, pt's subjective complaints have completely resolved and his vitals have been stable. See below for details by problem list.  #TIA with Hx of aneurysm s/p coiliing procedure - symptoms continued to improve from time of admission without specific intervention; appear subjectively completely resolved as of 1/1, two days prior to discharge  - TPA contraindicated with previous hemmorhage   - Recent work up In 12/2011 at [facility]    Carotid dopplers - no stenotic flow or critical diameters    MRI head - multiple infarcts (new and old) and evidence of subarachnoid hemorrhage (old).    TTE - EF 50%    CT angio neck/head - no significant occlusive  or stenotic extra or intracranial disease.    - Neurology consulted, recommended ASA 81 mg (started 1/1), EEG (read as normal, above), and imaging   -plan was for MRI/MRA head (and neck as well to evaluate for syrinx)   -radiology was unable to retrieve records from [facility] about previous coiling of old aneurysm --> given  no further symptoms and unlikely for result to change management, imaging has been deferred (see recommendations for f/u)  -PT/OT originally recommended home health PT/OT, but further eval recommended only 24h supervision and rolling walker to be provided prior to discharge  -Otherwise pt was continued on home Lipitor   #Seizure d/o   - Stable, EEG normal and not suggestive of current seizure activity   - UDS negative for substances   - Continued home dose of Lamictal   #HTN   - Well controlled on home meds   - Continue amlodipine 10 daily and HCTZ 25 daily   #Chronic Knee pain   - S/p R knee arthroplasty and long Hx of arthritis causing difficulty to walk   - Home dose of oxycodone- 5 mg PRN daily    #Urinary incontinence - At baseline. Pt was continued on home Proscar.  Discharge Medications:    Medication List     As of 07/20/2012  2:19 PM    TAKE these medications         amLODipine 10 MG tablet   Commonly known as: NORVASC   Take 10 mg by mouth daily.      aspirin 81 MG EC tablet   Take 1 tablet (81 mg total) by mouth daily.      atorvastatin 20 MG tablet   Commonly known as: LIPITOR   Take 10 mg by mouth at bedtime.      cyanocobalamin 500 MCG tablet   Take 1,000 mcg by mouth daily.      finasteride 5 MG tablet   Commonly known as: PROSCAR   Take 5 mg by mouth at bedtime.      fish oil-omega-3 fatty acids 1000 MG capsule   Take 2 g by mouth 2 (two) times daily.      hydrochlorothiazide 25 MG tablet   Commonly known as: HYDRODIURIL   Take 25 mg by mouth daily.      lamoTRIgine 200 MG tablet   Commonly known as: LAMICTAL   Take 200 mg by mouth daily.      oxyCODONE-acetaminophen 5-325 MG per tablet   Commonly known as: PERCOCET/ROXICET   Take 1 tablet by mouth daily as needed. For pain      pramipexole 0.25 MG tablet   Commonly known as: MIRAPEX   Take 0.25 mg by mouth at bedtime.      traZODone 100 MG tablet   Commonly known as: DESYREL   Take 100  mg by mouth at bedtime.       Disposition: discharge home  Issues for Follow Up: 1. TIA - Symptoms completely resolved, as above. Per neuorology recommendations, pt was restarted on ASA 81 mg daily. Plan was for MRI/MRA head (and neck as well to evaluate for syrinx), but we will defer to outpt follow-up, as results at this point would be unlikely to change acute management and pt had recent work-up at [facility]. Evaluate as appropriate. 2. Chronic issues - No other changes to previous home medications were made. Please address chronic issues and any further subjective or objective concerns/findings as appropriate.  Outstanding Results: none  Discharge Instructions:  Please refer to Patient Instructions section of EMR for full details.  Patient was counseled important signs and symptoms that should prompt return to medical care, changes in medications, dietary instructions, activity restrictions, and follow up appointments.       Follow-up Information    [facility]. Schedule an appointment as soon as possible for a visit in 2 weeks.        Discharge Condition: stable  Kurt Hoffmeier, [place], MD 07/20/2012, 2:19 PM

## 2012-07-20 NOTE — Progress Notes (Signed)
Physical Therapy Treatment Patient Details Name: Noah Lee MRN: [id number] DOB: 1946-06-10 Today's Date: 07/20/2012 Time: 4098-1191 PT Time Calculation (min): 27 min  PT Assessment / Plan / Recommendation Comments on Treatment Session  Pt continues to make steady progress towards PT goals at this time. Rec continued use of RW upon d/c.    Follow Up Recommendations  Supervision/Assistance - 24 hour;No PT follow up     Does the patient have the potential to tolerate intense rehabilitation     Barriers to Discharge        Equipment Recommendations  Rolling walker with 5" wheels;Other (comment)    Recommendations for Other Services    Frequency Min 4X/week   Plan Discharge plan remains appropriate;Frequency remains appropriate    Precautions / Restrictions Precautions Precautions: Fall Restrictions Weight Bearing Restrictions: No       Mobility  Bed Mobility Bed Mobility: Sit to Supine;Supine to Sit Supine to Sit: 6: Modified independent (Device/Increase time) Sitting - Scoot to Edge of Bed: 6: Modified independent (Device/Increase time) Transfers Transfers: Sit to Stand;Stand to Sit Sit to Stand: 5: Supervision;With upper extremity assist;From bed Stand to Sit: 5: Supervision;With upper extremity assist;To chair/3-in-1 Details for Transfer Assistance: VC's for hand placement and to not abandon rw Ambulation/Gait Ambulation/Gait Assistance: 5: Supervision Ambulation Distance (Feet): 240 Feet Assistive device: Rolling walker Ambulation/Gait Assistance Details: Some mild dizziness with ambulation but this resolved with rest break Gait Pattern: Within Functional Limits Gait velocity: decr Stairs: Yes Stairs Assistance: 5: Supervision Stairs Assistance Details (indicate cue type and reason): VC's for body control Stair Management Technique: Two rails;Step to pattern;Forwards Number of Stairs: 5  Modified Rankin (Stroke Patients Only) Modified Rankin: Moderate  disability    Exercises General Exercises - Lower Extremity Ankle Circles/Pumps: AAROM;10 reps;Both Long Arc Quad: AROM;Both;10 reps     PT Goals Acute Rehab PT Goals PT Goal: Supine/Side to Sit - Progress: Progressing toward goal PT Goal: Sit to Stand - Progress: Progressing toward goal PT Goal: Stand to Sit - Progress: Progressing toward goal PT Goal: Stand - Progress: Progressing toward goal PT Goal: Ambulate - Progress: Progressing toward goal PT Goal: Up/Down Stairs - Progress: Met  Visit Information  Last PT Received On: 07/20/12 Assistance Needed: +1    Subjective Data  Subjective: I really look forward to getting up and moving Patient Stated Goal: to go home   Cognition  Overall Cognitive Status: Appears within functional limits for tasks assessed/performed Arousal/Alertness: Awake/alert Orientation Level: Appears intact for tasks assessed Behavior During Session: [facility] for tasks performed    Balance  Static Sitting Balance Static Sitting - Balance Support: Bilateral upper extremity supported;Feet supported Static Sitting - Level of Assistance: 7: Independent Static Sitting - Comment/# of Minutes: mat table 3 minutes  End of Session PT - End of Session Equipment Utilized During Treatment: Gait belt Activity Tolerance: Patient tolerated treatment well Patient left: in chair;with call bell/phone within reach Nurse Communication: Mobility status   GP     Fabio Asa 07/20/2012, 3:39 PM

## 2012-07-20 NOTE — Progress Notes (Signed)
Interim Note  Given no further subjective symptoms/complaints and difficulty obtaining records for repeat MRI, and that MRI would not likely change our management at this time, decision was made to forego MRI at this time and defer to [facility] follow-up. Recommendations for considerations of further work-up to be detailed in his discharge summary.  Additionally, because pt does not have insurance and his diagnosis is TIA (not CVA), home health PT/OT is not arranged. Follow-up PT evaluation recommends no specific PT therapy, in any case, and case management is assisting with providing pt with a rolling walker. Assistance is greatly appreciated!  Pt to be discharged, this afternoon. Bobbye Morton, MD PGY-1, [facility]

## 2012-07-20 NOTE — Discharge Summary (Signed)
Family Medicine Teaching Service  Discharge Note : Attending Sameena Artus MD Pager [phone] Office [phone] I have seen and examined this patient, reviewed their chart and discussed discharge planning wit the resident at the time of discharge. I agree with the discharge plan as above.  

## 2012-07-20 NOTE — Progress Notes (Signed)
Family Medicine Teaching Service Daily Progress Note Service Page: [phone]  Patient Assessment: 67 y.o. year old male with PMH of HTN aneurysm s/p coiling, and CVA times 2 presenting presenting with a TIA.   Subjective: Pt seen at bedside. States he feels okay but did not sleep well last night. Continues with no further upper extremity numbness/tingling, denies pain, SOB, fever, N/V.  Objective: Temp:  [97.5 F (36.4 C)-98.2 F (36.8 C)] 97.5 F (36.4 C) (01/03 0702) Pulse Rate:  [61-83] 61  (01/03 0702) Resp:  [20] 20  (01/03 0702) BP: (116-146)/(40-93) 124/66 mmHg (01/03 0702) SpO2:  [96 %-98 %] 98 % (01/03 0702) Exam: Gen: elderly male initially sleeping but easily arousable, in NAD; alert but tired, pleasant and cooperative CV: RRR, normal S1/S2, no murmur appreciated Resp: CTABL, no wheezes Abd: soft, nontender, nondistended, BS + Ext: No LE edema, distal pulses 2+ and symmetric; longitudinal scar over R knee  Neuro: A&Ox3, no gross focal deficit  Laboratory: CBC BMET   Lab 07/16/12 1530  WBC 5.9  HGB 12.5*  HCT 38.0*  PLT 225    Lab 07/16/12 1530  NA 140  K 3.6  CL 103  CO2 27  BUN 9  CREATININE 0.86  GLUCOSE 144*  CALCIUM 8.7    UDS 12/30: all negative Lipid panel 12/31  Total cholesterol 144  TG 116  HDL 58  LDL 63  Imaging/Diagnostic Tests: 07/16/12:  CT head: Negative for acute findings CXR: thoracic spondylosis and no acute findings 12/31: EEG: normal without seizure or seizure predisposition  Plan: Noah Lee is a 67 y.o. year old male with PMH of HTN aneurysm s/p coiling, and CVA times 2 presenting presenting with a TIA. Admitted 12/30.  #TIA with Hx of aneurysm s/p coiliing procedure - symptoms continue to Improve; appear subjectively completely resolved  - ABCD2 Score of 5  - TPA contraindicated with previous hemmorhage  - Recent work up In 12/2011 at [facility]   Carotid dopplers - no stenotic flow or critical diameters   MRI  head - multiple infarcts (new and old) and evidence of subarachnoid hemorrhage (old).    TTE - EF 50%   CT angio neck/head - no significant occlusive or stenotic extra or intracranial disease.   Plan:    - Neurology assistance appreciated; ASA 81 mg started 1/1    - MRI/MRA results; neck as well as head to evaluate for syrinx    -imaging pending this AM    -radiology awaiting records from [facility] about previous coiling of old aneurysm   - Home health PT/OT ordered per recommendations   - tele monitoring, continue Lipitor  #Seizure d/o   - Stable, EEG normal and not suggestive of current seizure activity  - UDS negative for substances   - Continue home dose of Lamictal    #HTN   - Well controlled on home meds  - Continue  amlodipine 10 daily and HCTZ 25 daily  #Chronic Knee pain   - S/p R knee arthroplasty and long Hx of arthritis causing difficulty to walk   - Up with assistance   - Home dose of oxycodone- 5 mg PRN daily   - Home health PT/OT, as above  #Urinary incontinence  - At baseline   - Will monitor and continue Proscar   FEN/GI: heart healthy diet Prophylaxis: Lovenox  Disposition: Home pending completion of work-up  MRI/MRA hopefully today, then [facility] [place]  home health and case management  consult ordered/requested, assistance appreciated Code Status: Full  Noah Lee, Noah Deer, MD 07/20/2012, 9:36 AM

## 2017-07-10 ENCOUNTER — Other Ambulatory Visit: Payer: Self-pay

## 2017-07-10 ENCOUNTER — Emergency Department (HOSPITAL_COMMUNITY): Payer: Medicare Other

## 2017-07-10 ENCOUNTER — Emergency Department (HOSPITAL_COMMUNITY)
Admission: EM | Admit: 2017-07-10 | Discharge: 2017-07-10 | Disposition: A | Discharge status: Home / Self Care | Payer: Medicare Other | Attending: Emergency Medicine | Admitting: Emergency Medicine

## 2017-07-10 ENCOUNTER — Encounter (HOSPITAL_COMMUNITY): Payer: Self-pay | Admitting: Radiology

## 2017-07-10 DIAGNOSIS — Z87891 Personal history of nicotine dependence: Secondary | ICD-10-CM | POA: Insufficient documentation

## 2017-07-10 DIAGNOSIS — Z8673 Personal history of transient ischemic attack (TIA), and cerebral infarction without residual deficits: Secondary | ICD-10-CM | POA: Insufficient documentation

## 2017-07-10 DIAGNOSIS — Z79899 Other long term (current) drug therapy: Secondary | ICD-10-CM | POA: Diagnosis not present

## 2017-07-10 DIAGNOSIS — I10 Essential (primary) hypertension: Secondary | ICD-10-CM | POA: Insufficient documentation

## 2017-07-10 DIAGNOSIS — R531 Weakness: Secondary | ICD-10-CM | POA: Diagnosis not present

## 2017-07-10 DIAGNOSIS — R4182 Altered mental status, unspecified: Secondary | ICD-10-CM | POA: Diagnosis present

## 2017-07-10 DIAGNOSIS — Z7982 Long term (current) use of aspirin: Secondary | ICD-10-CM | POA: Insufficient documentation

## 2017-07-10 LAB — I-STAT CHEM 8, ED
BUN: 11 mg/dL (ref 6–20)
CALCIUM ION: 1.11 mmol/L — AB (ref 1.15–1.40)
Chloride: 99 mmol/L — ABNORMAL LOW (ref 101–111)
Creatinine, Ser: 0.7 mg/dL (ref 0.61–1.24)
Glucose, Bld: 101 mg/dL — ABNORMAL HIGH (ref 65–99)
HEMATOCRIT: 45 % (ref 39.0–52.0)
Hemoglobin: 15.3 g/dL (ref 13.0–17.0)
Potassium: 3.3 mmol/L — ABNORMAL LOW (ref 3.5–5.1)
SODIUM: 139 mmol/L (ref 135–145)
TCO2: 30 mmol/L (ref 22–32)

## 2017-07-10 LAB — URINALYSIS, ROUTINE W REFLEX MICROSCOPIC
BACTERIA UA: NONE SEEN
BILIRUBIN URINE: NEGATIVE
Glucose, UA: NEGATIVE mg/dL
HGB URINE DIPSTICK: NEGATIVE
KETONES UR: NEGATIVE mg/dL
LEUKOCYTES UA: NEGATIVE
NITRITE: NEGATIVE
PROTEIN: 30 mg/dL — AB
Specific Gravity, Urine: 1.028 (ref 1.005–1.030)
Squamous Epithelial / LPF: NONE SEEN
pH: 6 (ref 5.0–8.0)

## 2017-07-10 LAB — CBC
HCT: 44.5 % (ref 39.0–52.0)
Hemoglobin: 15.5 g/dL (ref 13.0–17.0)
MCH: 31.2 pg (ref 26.0–34.0)
MCHC: 34.8 g/dL (ref 30.0–36.0)
MCV: 89.5 fL (ref 78.0–100.0)
PLATELETS: 300 10*3/uL (ref 150–400)
RBC: 4.97 MIL/uL (ref 4.22–5.81)
RDW: 12.4 % (ref 11.5–15.5)
WBC: 8.9 10*3/uL (ref 4.0–10.5)

## 2017-07-10 LAB — COMPREHENSIVE METABOLIC PANEL
ALK PHOS: 58 U/L (ref 38–126)
ALT: 13 U/L — ABNORMAL LOW (ref 17–63)
ANION GAP: 9 (ref 5–15)
AST: 17 U/L (ref 15–41)
Albumin: 3.9 g/dL (ref 3.5–5.0)
BUN: 9 mg/dL (ref 6–20)
CALCIUM: 9 mg/dL (ref 8.9–10.3)
CO2: 26 mmol/L (ref 22–32)
Chloride: 103 mmol/L (ref 101–111)
Creatinine, Ser: 0.75 mg/dL (ref 0.61–1.24)
GFR calc non Af Amer: >60 mL/min (ref >60)
Glucose, Bld: 107 mg/dL — ABNORMAL HIGH (ref 65–99)
Potassium: 3.1 mmol/L — ABNORMAL LOW (ref 3.5–5.1)
SODIUM: 138 mmol/L (ref 135–145)
Total Bilirubin: 0.7 mg/dL (ref 0.3–1.2)
Total Protein: 6.8 g/dL (ref 6.5–8.1)

## 2017-07-10 LAB — DIFFERENTIAL
BASOS PCT: 0 %
Basophils Absolute: 0 10*3/uL (ref 0.0–0.1)
EOS ABS: 0.1 10*3/uL (ref 0.0–0.7)
EOS PCT: 1 %
Lymphocytes Relative: 13 %
Lymphs Abs: 1.1 10*3/uL (ref 0.7–4.0)
MONO ABS: 0.5 10*3/uL (ref 0.1–1.0)
Monocytes Relative: 5 %
Neutro Abs: 7.2 10*3/uL (ref 1.7–7.7)
Neutrophils Relative %: 81 %

## 2017-07-10 LAB — PROTIME-INR
INR: 1.05
PROTHROMBIN TIME: 13.6 s (ref 11.4–15.2)

## 2017-07-10 LAB — RAPID URINE DRUG SCREEN, HOSP PERFORMED
AMPHETAMINES: NOT DETECTED
Barbiturates: NOT DETECTED
Benzodiazepines: NOT DETECTED
Cocaine: NOT DETECTED
OPIATES: NOT DETECTED
Tetrahydrocannabinol: NOT DETECTED

## 2017-07-10 LAB — D-DIMER, QUANTITATIVE (NOT AT ARMC): D DIMER QUANT: 0.52 ug{FEU}/mL — AB (ref 0.00–0.50)

## 2017-07-10 LAB — I-STAT TROPONIN, ED: Troponin i, poc: 0 ng/mL (ref 0.00–0.08)

## 2017-07-10 LAB — APTT: aPTT: 28 seconds (ref 24–36)

## 2017-07-10 LAB — ETHANOL

## 2017-07-10 LAB — CBG MONITORING, ED: Glucose-Capillary: 111 mg/dL — ABNORMAL HIGH (ref 65–99)

## 2017-07-10 MED ORDER — SODIUM CHLORIDE 0.9 % IV BOLUS (SEPSIS)
1000.0000 mL | Freq: Once | INTRAVENOUS | Status: AC
  Administered 2017-07-10: 1000 mL via INTRAVENOUS

## 2017-07-10 MED ORDER — IOPAMIDOL (ISOVUE-370) INJECTION 76%
INTRAVENOUS | Status: AC
  Administered 2017-07-10: 100 mL
  Filled 2017-07-10: qty 100

## 2017-07-10 NOTE — ED Notes (Signed)
Patient transported to X-ray 

## 2017-07-10 NOTE — ED Notes (Signed)
Christoper AllegraKatheryn Decuir has healthcare power of attorney for patient. [phone]

## 2017-07-10 NOTE — ED Provider Notes (Signed)
[facility] EMERGENCY DEPARTMENT Provider Note   CSN: [id number] Arrival date & time: 07/10/17  1510     History   Chief Complaint Chief Complaint  Patient presents with  . Fall  . Weakness    HPI Noah Lee is a 70 y.o. male history of review of cerebral aneurysm status post coiling, seizure, here presenting with altered mental status.  Patient apparently drove to [facility] and back yesterday, altogether about 10 hours.  Patient does live at home with his daughter.  The daughter noticed that he appeared altered today and appeared weak.  He apparently tripped over the carpet and fell.  He denies passing out or head injury or chest pain.  Patient had previous TIA in the past but denies any trouble speaking currently.  The history is provided by the patient.    Past Medical History:  Diagnosis Date  . Benign prostatic hyperplasia   . Cerebral aneurysm   . GERD (gastroesophageal reflux disease)   . High cholesterol   . Hypertension   . Pneumonia   . Restless leg syndrome   . Seizure disorder ([place])   . Seizures ([place])   . Stroke [facility])     Patient Active Problem List   Diagnosis Date Noted  . TIA (transient ischemic attack) 07/17/2012  . Seizure disorder ([place]) 07/17/2012  . BPH (benign prostatic hyperplasia) 07/17/2012  . HTN (hypertension) 07/17/2012    Past Surgical History:  Procedure Laterality Date  . CEREBRAL ANEURYSM REPAIR    . CEREBRAL ANEURYSM REPAIR    . CHOLECYSTECTOMY    . KNEE ARTHROPLASTY         Home Medications    Prior to Admission medications   Medication Sig Start Date End Date Taking? Authorizing Provider  amLODipine (NORVASC) 10 MG tablet Take 10 mg by mouth daily.    [provider]  aspirin EC 81 MG EC tablet Take 1 tablet (81 mg total) by mouth daily. 07/19/12   Street, Sharon Mt, MD  atorvastatin (LIPITOR) 20 MG tablet Take 10 mg by mouth at bedtime.    [provider]  cyanocobalamin 500  MCG tablet Take 1,000 mcg by mouth daily.    [provider]  finasteride (PROSCAR) 5 MG tablet Take 5 mg by mouth at bedtime.    [provider]  fish oil-omega-3 fatty acids 1000 MG capsule Take 2 g by mouth 2 (two) times daily.    [provider]  hydrochlorothiazide (HYDRODIURIL) 25 MG tablet Take 25 mg by mouth daily.    [provider]  lamoTRIgine (LAMICTAL) 200 MG tablet Take 200 mg by mouth daily.    [provider]  oxyCODONE-acetaminophen (PERCOCET/ROXICET) 5-325 MG per tablet Take 1 tablet by mouth daily as needed. For pain    [provider]  pramipexole (MIRAPEX) 0.25 MG tablet Take 0.25 mg by mouth at bedtime.    [provider]  traZODone (DESYREL) 100 MG tablet Take 100 mg by mouth at bedtime.    [provider]    Family History No family history on file.  Social History Social History   Tobacco Use  . Smoking status: Former Research scientist (life sciences)  . Smokeless tobacco: Never Used  Substance Use Topics  . Alcohol use: Yes    Comment: occasional  . Drug use: No     Allergies   Patient has no known allergies.   Review of Systems Review of Systems  Neurological: Positive for weakness.  All other systems  reviewed and are negative.    Physical Exam Updated Vital Signs BP 125/73   Pulse 70   Temp 97.7 F (36.5 C) (Oral)   Resp 14   SpO2 97%   Physical Exam  Constitutional: He appears well-developed.  Chronically ill, NAD, slightly confused   HENT:  Head: Normocephalic and atraumatic.  Mouth/Throat: Oropharynx is clear and moist.  Eyes: Conjunctivae and EOM are normal. Pupils are equal, round, and reactive to light.  Neck: Normal range of motion. Neck supple.  Cardiovascular: Normal rate, regular rhythm and normal heart sounds.  Pulmonary/Chest: Effort normal and breath sounds normal. No stridor. No respiratory distress. He has no wheezes.  Abdominal: Soft. Bowel sounds are normal. He exhibits no  distension. There is no tenderness. There is no guarding.  Musculoskeletal: Normal range of motion.  Neurological: He is alert.  Demented, A & O x 2. Strength 4/5 R arm and leg, 5/5 L arm and leg. No obvious facial droop.   Skin: Skin is warm.  Psychiatric: He has a normal mood and affect.  Nursing note and vitals reviewed.    ED Treatments / Results  Labs (all labs ordered are listed, but only abnormal results are displayed) Labs Reviewed  COMPREHENSIVE METABOLIC PANEL - Abnormal; Notable for the following components:      Result Value   Potassium 3.1 (*)    Glucose, Bld 107 (*)    ALT 13 (*)    All other components within normal limits  URINALYSIS, ROUTINE W REFLEX MICROSCOPIC - Abnormal; Notable for the following components:   Protein, ur 30 (*)    All other components within normal limits  D-DIMER, QUANTITATIVE (NOT AT [facility]) - Abnormal; Notable for the following components:   D-Dimer, Quant 0.52 (*)    All other components within normal limits  CBG MONITORING, ED - Abnormal; Notable for the following components:   Glucose-Capillary 111 (*)    All other components within normal limits  I-STAT CHEM 8, ED - Abnormal; Notable for the following components:   Potassium 3.3 (*)    Chloride 99 (*)    Glucose, Bld 101 (*)    Calcium, Ion 1.11 (*)    All other components within normal limits  ETHANOL  PROTIME-INR  APTT  CBC  DIFFERENTIAL  RAPID URINE DRUG SCREEN, HOSP PERFORMED  I-STAT TROPONIN, ED    EKG  EKG Interpretation  Date/Time:  Monday July 10 2017 15:31:57 EST Ventricular Rate:  70 PR Interval:    QRS Duration: 109 QT Interval:  382 QTC Calculation: 413 R Axis:   83 Text Interpretation:  Sinus rhythm Borderline right axis deviation Probable anteroseptal infarct, old No significant change since last tracing Confirmed by Wandra Arthurs [phone]) on 07/10/2017 3:35:22 PM       Radiology Dg Chest 2 View  Result Date: 07/10/2017 CLINICAL DATA:  Fall at  home today after 10 hour trip yesterday. EXAM: CHEST  2 VIEW COMPARISON:  01/31/2017 and 01/20/2017 FINDINGS: Lungs are adequately inflated without focal consolidation or effusion. Cardiomediastinal silhouette and remainder the exam is unchanged. IMPRESSION: No active cardiopulmonary disease. Electronically Signed   By: Marin Olp M.D.   On: 07/10/2017 16:14   Dg Pelvis 1-2 Views  Result Date: 07/10/2017 CLINICAL DATA:  Fall at home today after 10 hour long trip yesterday. EXAM: PELVIS - 1-2 VIEW COMPARISON:  10/14/2003 FINDINGS: Mild symmetric degenerative change of the hips. No evidence of acute fracture or dislocation. There are degenerative changes of the spine.  IMPRESSION: No acute findings. Electronically Signed   By: Marin Olp M.D.   On: 07/10/2017 16:14   Ct Head Wo Contrast  Result Date: 07/10/2017 CLINICAL DATA:  Slurred speech. EXAM: CT HEAD WITHOUT CONTRAST TECHNIQUE: Contiguous axial images were obtained from the base of the skull through the vertex without intravenous contrast. COMPARISON:  CT scan of February teen 2016. FINDINGS: Brain: Mild diffuse cortical atrophy is noted. Mild chronic ischemic white matter disease is noted. No mass effect or midline shift is noted. Ventricular size is within normal limits. There is no evidence of mass lesion, hemorrhage or acute infarction. Vascular: No hyperdense vessel or unexpected calcification. Skull: Normal. Negative for fracture or focal lesion. Sinuses/Orbits: Mild bilateral maxillary sinusitis. Other: None. IMPRESSION: Mild diffuse cortical atrophy. Mild chronic ischemic white matter disease. No acute intracranial abnormality seen. Electronically Signed   By: Marijo Conception, M.D.   On: 07/10/2017 16:25   Ct Angio Chest Pe W And/or Wo Contrast  Result Date: 07/10/2017 CLINICAL DATA:  Chest pain EXAM: CT ANGIOGRAPHY CHEST WITH CONTRAST TECHNIQUE: Multidetector CT imaging of the chest was performed using the standard protocol during  bolus administration of intravenous contrast. Multiplanar CT image reconstructions and MIPs were obtained to evaluate the vascular anatomy. CONTRAST:  51 mL Isovue 370 intravenous COMPARISON:  07/10/2017 FINDINGS: Cardiovascular: Satisfactory opacification of the pulmonary arteries to the segmental level. No evidence of pulmonary embolism. Nonaneurysmal aorta. Atherosclerotic calcifications. Coronary artery calcification. Normal heart size. No pericardial effusion Mediastinum/Nodes: No enlarged mediastinal, hilar, or axillary lymph nodes. Thyroid gland, trachea, and esophagus demonstrate no significant findings. Lungs/Pleura: Moderate emphysema. No consolidation, pneumothorax or pleural effusion. Upper Abdomen: Surgical clips in the gallbladder fossa. Partially visualized probable cyst in the upper pole of the right kidney. Partially visualized stones within the upper poles of the kidneys. Musculoskeletal: Degenerative changes. No acute or suspicious bone lesion Review of the MIP images confirms the above findings. IMPRESSION: 1. Negative for acute pulmonary embolus. 2. Moderate emphysema. Negative for pleural effusion, infiltrate or pneumothorax 3. Partially visualized stones within the upper poles of the kidneys. Aortic Atherosclerosis (ICD10-I70.0) and Emphysema (ICD10-J43.9). Electronically Signed   By: Donavan Foil M.D.   On: 07/10/2017 20:16   Mr Angiogram Head Wo Contrast  Result Date: 07/10/2017 CLINICAL DATA:  Tripped and fell at home. Weakness. Recent long road trip. History ofcerebral aneurysm repair, hypertension, hypercholesterolemia, seizures and stroke. EXAM: MRI HEAD WITHOUT CONTRAST MRA HEAD WITHOUT CONTRAST TECHNIQUE: Multiplanar, multiecho pulse sequences of the brain and surrounding structures were obtained without intravenous contrast. Angiographic images of the head were obtained using MRA technique without contrast. COMPARISON:  CT HEAD July 10, 2017 at 1552 hours. MRI of the head  July 01, 2009 FINDINGS: MRI HEAD FINDINGS BRAIN: No reduced diffusion to suggest acute ischemia. Superficial siderosis increased from prior MRI. Scattered chronic micro hemorrhages. Moderate to severe parenchymal brain volume loss, no hydrocephalus. Old bilateral basal ganglia lacunar infarcts. Small area LEFT frontal and LEFT temporal lobe encephalomalacia. Confluent supratentorial white matter FLAIR T2 hyperintensities. Prominent basal ganglia perivascular spaces associated with chronic small vessel ischemic disease. No suspicious parenchymal signal, mass or mass effect. No abnormal extra-axial fluid collections. VASCULAR: Normal major intracranial vascular flow voids present at skull base. Status post LEFT PCOM origin coil embolization. SKULL AND UPPER CERVICAL SPINE: No abnormal sellar expansion. No suspicious calvarial bone marrow signal. Craniocervical junction maintained. SINUSES/ORBITS: Bilateral maxillary sinus air-fluid levels. Small LEFT mastoid effusion. The included ocular globes and orbital contents are non-suspicious.  Status post bilateral ocular lens implants. OTHER: None. MRA HEAD FINDINGS ANTERIOR CIRCULATION: Normal flow related enhancement of the included cervical, petrous, cavernous and supraclinoid internal carotid arteries. 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 Patent anterior communicating artery. Patent anterior and middle cerebral arteries, including distal segments. No large vessel occlusion, flow limiting stenosis. POSTERIOR CIRCULATION: RIGHT vertebral artery is dominant. Basilar artery is patent, with normal flow related enhancement of the main branch vessels. RIGHT posterior-inferior cerebellar artery not visualized, potentially artifact. Patent posterior cerebral arteries. No large vessel occlusion, flow limiting stenosis,  aneurysm. ANATOMIC VARIANTS: Supernumerary anterior cerebral artery arising from LEFT A1-2 junction. Source  images and MIP images were reviewed. IMPRESSION: MRI HEAD: 1. No acute intracranial process. 2. 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 3. Moderate to severe parenchymal brain volume loss, advanced for age. 4. Old LEFT frontal and temporal lobe/MCA territory small infarcts. 5. Severe chronic small vessel ischemic disease and old lacunar infarcts. MRA HEAD: 1. No emergent large vessel occlusion or flow limiting stenosis. 2. Status post LEFT PCOM origin aneurysm coil embolization. 3. 4 mm RIGHT paraophthalmic ICA aneurysm. Neuro-Interventional Radiology consultation is suggested to evaluate the appropriateness of potential treatment. Non-emergent evaluation can be arranged by calling [phone] during usual hours. Emergency evaluation can be requested by paging [phone]. Electronically Signed   By: Elon Alas M.D.   On: 07/10/2017 20:15   Mr Brain Wo Contrast  Result Date: 07/10/2017 CLINICAL DATA:  Tripped and fell at home. Weakness. Recent long road trip. History ofcerebral aneurysm repair, hypertension, hypercholesterolemia, seizures and stroke. EXAM: MRI HEAD WITHOUT CONTRAST MRA HEAD WITHOUT CONTRAST TECHNIQUE: Multiplanar, multiecho pulse sequences of the brain and surrounding structures were obtained without intravenous contrast. Angiographic images of the head were obtained using MRA technique without contrast. COMPARISON:  CT HEAD July 10, 2017 at 1552 hours. MRI of the head July 01, 2009 FINDINGS: MRI HEAD FINDINGS BRAIN: No reduced diffusion to suggest acute ischemia. Superficial siderosis increased from prior MRI. Scattered chronic micro hemorrhages. Moderate to severe parenchymal brain volume loss, no hydrocephalus. Old bilateral basal ganglia lacunar infarcts. Small area LEFT frontal and LEFT temporal lobe encephalomalacia. Confluent supratentorial white matter FLAIR T2 hyperintensities. Prominent basal ganglia perivascular spaces associated with chronic small  vessel ischemic disease. No suspicious parenchymal signal, mass or mass effect. No abnormal extra-axial fluid collections. VASCULAR: Normal major intracranial vascular flow voids present at skull base. Status post LEFT PCOM origin coil embolization. SKULL AND UPPER CERVICAL SPINE: No abnormal sellar expansion. No suspicious calvarial bone marrow signal. Craniocervical junction maintained. SINUSES/ORBITS: Bilateral maxillary sinus air-fluid levels. Small LEFT mastoid effusion. The included ocular globes and orbital contents are non-suspicious. Status post bilateral ocular lens implants. OTHER: None. MRA HEAD FINDINGS ANTERIOR CIRCULATION: Normal flow related enhancement of the included cervical, petrous, cavernous and supraclinoid internal carotid arteries. 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 Patent anterior communicating artery. Patent anterior and middle cerebral arteries, including distal segments. No large vessel occlusion, flow limiting stenosis. POSTERIOR CIRCULATION: RIGHT vertebral artery is dominant. Basilar artery is patent, with normal flow related enhancement of the main branch vessels. RIGHT posterior-inferior cerebellar artery not visualized, potentially artifact. Patent posterior cerebral arteries. No large vessel occlusion, flow limiting stenosis,  aneurysm. ANATOMIC VARIANTS: Supernumerary anterior cerebral artery arising from LEFT A1-2 junction. Source images and MIP images were reviewed. IMPRESSION: MRI HEAD: 1. No acute intracranial process. 2. 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 3. Moderate  to severe parenchymal brain volume loss, advanced for age. 4. Old LEFT frontal and temporal lobe/MCA territory small infarcts. 5. Severe chronic small vessel ischemic disease and old lacunar infarcts. MRA HEAD: 1. No emergent large vessel occlusion or flow limiting stenosis. 2. Status post LEFT PCOM origin aneurysm coil embolization. 3. 4 mm  RIGHT paraophthalmic ICA aneurysm. Neuro-Interventional Radiology consultation is suggested to evaluate the appropriateness of potential treatment. Non-emergent evaluation can be arranged by calling [phone] during usual hours. Emergency evaluation can be requested by paging [phone]. Electronically Signed   By: Elon Alas M.D.   On: 07/10/2017 20:15    Procedures Procedures (including critical care time)  Medications Ordered in ED Medications  sodium chloride 0.9 % bolus 1,000 mL (1,000 mLs Intravenous New Bag/Given 07/10/17 2056)  iopamidol (ISOVUE-370) 76 % injection (100 mLs  Contrast Given 07/10/17 1948)     Initial Impression / Assessment and Plan / ED Course  I have reviewed the triage vital signs and the nursing notes.  Pertinent labs & imaging results that were available during my care of the patient were reviewed by me and considered in my medical decision making (see chart for details).     Noah Lee is a 71 y.o. male here with fall, weakness. Unknown time of onset. Attempted to call daughter but unable to reach her. Has R sided weakness on exam, but no obvious slurred speech. Concerned for possible stroke vs TIA vs infection vs electrolyte abnormality. Had 10 hr ride yesterday so will get d-dimer as well but low suspicion for PE.   9:45 PM D-dimer mildly positive. CTA showed no PE. MRI/MRA showed previous subarachnoid with coiled aneurysm with no leakage. UA nl. Family at bedside and requesting placement. Case management met with family and he doesn't qualify to get placed or for admission for now.    Final Clinical Impressions(s) / ED Diagnoses   Final diagnoses:  Weakness    ED Discharge Orders    None       Drenda Freeze, MD 07/10/17 2145

## 2017-07-10 NOTE — ED Notes (Signed)
CBG taken at 15:25 was 111.

## 2017-07-10 NOTE — Discharge Instructions (Signed)
Continue your current meds.   Stay hydrated.   See your doctor.   Return to the ER if you have worse weakness, trouble walking, fever, vomiting, passing out.

## 2017-07-10 NOTE — ED Notes (Signed)
ED Provider at bedside. 

## 2017-07-10 NOTE — ED Triage Notes (Signed)
Patient tripped and fell on carpet at home. Patient states he is unsure when he started to feel weak. Patient states he went on a 10 hour road trip yesterday to [place] with family.

## 2017-07-10 NOTE — ED Notes (Signed)
Patient out to MRI then CT.

## 2017-09-08 ENCOUNTER — Inpatient Hospital Stay (HOSPITAL_COMMUNITY)
Admission: EM | Admit: 2017-09-08 | Discharge: 2017-09-19 | DRG: 866 | Disposition: A | Discharge status: Home / Self Care | Payer: Medicare Other | Attending: Internal Medicine | Admitting: Internal Medicine

## 2017-09-08 ENCOUNTER — Other Ambulatory Visit: Payer: Self-pay

## 2017-09-08 ENCOUNTER — Encounter (HOSPITAL_COMMUNITY): Payer: Self-pay

## 2017-09-08 DIAGNOSIS — G40909 Epilepsy, unspecified, not intractable, without status epilepticus: Secondary | ICD-10-CM

## 2017-09-08 DIAGNOSIS — K219 Gastro-esophageal reflux disease without esophagitis: Secondary | ICD-10-CM | POA: Diagnosis present

## 2017-09-08 DIAGNOSIS — R059 Cough, unspecified: Secondary | ICD-10-CM

## 2017-09-08 DIAGNOSIS — B349 Viral infection, unspecified: Principal | ICD-10-CM | POA: Diagnosis present

## 2017-09-08 DIAGNOSIS — F0391 Unspecified dementia with behavioral disturbance: Secondary | ICD-10-CM

## 2017-09-08 DIAGNOSIS — R05 Cough: Secondary | ICD-10-CM | POA: Diagnosis not present

## 2017-09-08 DIAGNOSIS — E785 Hyperlipidemia, unspecified: Secondary | ICD-10-CM | POA: Diagnosis present

## 2017-09-08 DIAGNOSIS — D72829 Elevated white blood cell count, unspecified: Secondary | ICD-10-CM

## 2017-09-08 DIAGNOSIS — N400 Enlarged prostate without lower urinary tract symptoms: Secondary | ICD-10-CM | POA: Diagnosis present

## 2017-09-08 DIAGNOSIS — Z79899 Other long term (current) drug therapy: Secondary | ICD-10-CM

## 2017-09-08 DIAGNOSIS — L8990 Pressure ulcer of unspecified site, unspecified stage: Secondary | ICD-10-CM | POA: Diagnosis present

## 2017-09-08 DIAGNOSIS — E7800 Pure hypercholesterolemia, unspecified: Secondary | ICD-10-CM | POA: Diagnosis present

## 2017-09-08 DIAGNOSIS — I10 Essential (primary) hypertension: Secondary | ICD-10-CM | POA: Diagnosis present

## 2017-09-08 DIAGNOSIS — Z8673 Personal history of transient ischemic attack (TIA), and cerebral infarction without residual deficits: Secondary | ICD-10-CM

## 2017-09-08 DIAGNOSIS — R6510 Systemic inflammatory response syndrome (SIRS) of non-infectious origin without acute organ dysfunction: Secondary | ICD-10-CM

## 2017-09-08 DIAGNOSIS — F329 Major depressive disorder, single episode, unspecified: Secondary | ICD-10-CM | POA: Diagnosis present

## 2017-09-08 DIAGNOSIS — E876 Hypokalemia: Secondary | ICD-10-CM | POA: Diagnosis present

## 2017-09-08 DIAGNOSIS — Z915 Personal history of self-harm: Secondary | ICD-10-CM

## 2017-09-08 DIAGNOSIS — Z87891 Personal history of nicotine dependence: Secondary | ICD-10-CM

## 2017-09-08 DIAGNOSIS — R509 Fever, unspecified: Secondary | ICD-10-CM

## 2017-09-08 DIAGNOSIS — F05 Delirium due to known physiological condition: Secondary | ICD-10-CM | POA: Diagnosis present

## 2017-09-08 DIAGNOSIS — R45851 Suicidal ideations: Secondary | ICD-10-CM | POA: Diagnosis present

## 2017-09-08 DIAGNOSIS — Z9049 Acquired absence of other specified parts of digestive tract: Secondary | ICD-10-CM

## 2017-09-08 DIAGNOSIS — G2581 Restless legs syndrome: Secondary | ICD-10-CM | POA: Diagnosis present

## 2017-09-08 DIAGNOSIS — Z8249 Family history of ischemic heart disease and other diseases of the circulatory system: Secondary | ICD-10-CM

## 2017-09-08 DIAGNOSIS — Z888 Allergy status to other drugs, medicaments and biological substances status: Secondary | ICD-10-CM

## 2017-09-08 DIAGNOSIS — Z7902 Long term (current) use of antithrombotics/antiplatelets: Secondary | ICD-10-CM

## 2017-09-08 DIAGNOSIS — F03918 Unspecified dementia, unspecified severity, with other behavioral disturbance: Secondary | ICD-10-CM

## 2017-09-08 DIAGNOSIS — B9789 Other viral agents as the cause of diseases classified elsewhere: Secondary | ICD-10-CM | POA: Diagnosis present

## 2017-09-08 NOTE — ED Notes (Signed)
ED Provider at bedside. 
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
## 2017-09-09 ENCOUNTER — Emergency Department (HOSPITAL_COMMUNITY): Payer: Medicare Other

## 2017-09-09 LAB — CBC WITH DIFFERENTIAL/PLATELET
BASOS PCT: 1 %
Basophils Absolute: 0 10*3/uL (ref 0.0–0.1)
EOS PCT: 6 %
Eosinophils Absolute: 0.4 10*3/uL (ref 0.0–0.7)
HCT: 45.5 % (ref 39.0–52.0)
Hemoglobin: 15 g/dL (ref 13.0–17.0)
LYMPHS ABS: 2.6 10*3/uL (ref 0.7–4.0)
Lymphocytes Relative: 35 %
MCH: 31.1 pg (ref 26.0–34.0)
MCHC: 33 g/dL (ref 30.0–36.0)
MCV: 94.4 fL (ref 78.0–100.0)
Monocytes Absolute: 0.4 10*3/uL (ref 0.1–1.0)
Monocytes Relative: 6 %
Neutro Abs: 4 10*3/uL (ref 1.7–7.7)
Neutrophils Relative %: 54 %
PLATELETS: 369 10*3/uL (ref 150–400)
RBC: 4.82 MIL/uL (ref 4.22–5.81)
RDW: 13.5 % (ref 11.5–15.5)
WBC: 7.5 10*3/uL (ref 4.0–10.5)

## 2017-09-09 LAB — BASIC METABOLIC PANEL
Anion gap: 13 (ref 5–15)
BUN: 12 mg/dL (ref 6–20)
CO2: 22 mmol/L (ref 22–32)
CREATININE: 0.82 mg/dL (ref 0.61–1.24)
Calcium: 9 mg/dL (ref 8.9–10.3)
Chloride: 102 mmol/L (ref 101–111)
GFR calc non Af Amer: >60 mL/min (ref >60)
Glucose, Bld: 117 mg/dL — ABNORMAL HIGH (ref 65–99)
POTASSIUM: 3.7 mmol/L (ref 3.5–5.1)
Sodium: 137 mmol/L (ref 135–145)

## 2017-09-09 LAB — RAPID URINE DRUG SCREEN, HOSP PERFORMED
Amphetamines: NOT DETECTED
Barbiturates: NOT DETECTED
Benzodiazepines: NOT DETECTED
Cocaine: NOT DETECTED
OPIATES: NOT DETECTED
Tetrahydrocannabinol: NOT DETECTED

## 2017-09-09 LAB — URINALYSIS, ROUTINE W REFLEX MICROSCOPIC
BILIRUBIN URINE: NEGATIVE
Glucose, UA: NEGATIVE mg/dL
HGB URINE DIPSTICK: NEGATIVE
Ketones, ur: NEGATIVE mg/dL
Leukocytes, UA: NEGATIVE
Nitrite: NEGATIVE
PROTEIN: NEGATIVE mg/dL
Specific Gravity, Urine: 1.015 (ref 1.005–1.030)
pH: 7 (ref 5.0–8.0)

## 2017-09-09 LAB — ETHANOL: Alcohol, Ethyl (B): <10 mg/dL (ref <10)

## 2017-09-09 MED ORDER — LORAZEPAM 1 MG PO TABS
1.0000 mg | ORAL_TABLET | Freq: Once | ORAL | Status: AC
  Administered 2017-09-09: 1 mg via ORAL
  Filled 2017-09-09: qty 1

## 2017-09-09 NOTE — ED Notes (Signed)
[facility] - wearing burgundy scrubs. Sitter w/pt. Lunch tray delivered to pt.

## 2017-09-09 NOTE — ED Notes (Signed)
ORDERED DIET TRAY FOR PT  

## 2017-09-09 NOTE — ED Notes (Signed)
Dr Denton LankSteinl aware pt is attempting to leave - stating he wants to go back to his "room upstairs". [facility]. RN able to escort pt back to room after much encouragement. Ativan given as ordered. Offered for pt to sit in chair, watch tv, or read a magazine. Pt sat on bed - stated he did not want to read. Pt given graham crackers and peanut butter as requested.

## 2017-09-09 NOTE — ED Notes (Signed)
Hyacinth MeekerMiller MD wants nursing staff to attempt to contact patients daughter for current medication list-- no home medications ordered at this time
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
## 2017-09-09 NOTE — ED Notes (Signed)
[name], CM, arrived to see pt. Aware pt is recommended for Inpt Geri-Psych.

## 2017-09-09 NOTE — ED Notes (Addendum)
Pt walked out of room and attempting to leave the hospital. Pt redirected back to room. Pt given cup of water.

## 2017-09-09 NOTE — ED Provider Notes (Signed)
Family/friend notes history dementia w psychiatric/behavioral symptoms, agitation, confusion, aggressive behavior, and that symptoms have been progressive. They request BH eval.  BH team consulted ?[facility].   Patient currently calm and alert, cooperative at this time.   Vitals:   09/09/17 0200 09/09/17 0820  BP: 140/79 (!) 146/70  Pulse: 75   Resp: 18   Temp:    SpO2: 100%     [facility]    Cathren LaineSteinl, Lashondra Vaquerano, MD 09/09/17 1044

## 2017-09-09 NOTE — ED Notes (Signed)
This RN called lab and they said they did not receive labs. This RN recollected labs and sent to lab station 12. Horton, MD notified.

## 2017-09-09 NOTE — ED Notes (Signed)
Regular Diet ordered for Dinner. 

## 2017-09-09 NOTE — ED Notes (Signed)
TTS set up for pt in room

## 2017-09-09 NOTE — ED Notes (Signed)
Pt asked to speak w/RN. Pt lying on bed. Stated he was ready to rest now - stated "I think my thoughts are getting back together now".

## 2017-09-09 NOTE — ED Notes (Addendum)
This RN attempted to use condom catheter for urine collection. Pt removed it himself. Horton, MD made aware

## 2017-09-09 NOTE — BHH Counselor (Signed)
Call to dtr, Denver FasterKatherine Jenkins. No answer.
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
## 2017-09-09 NOTE — ED Notes (Signed)
Pt in hallway saying he wants to go home. Pt has urinated on himself. Security assisted this RN in guiding pt back to the room. Pt changed and pericare performed. Horton, MD notified.

## 2017-09-09 NOTE — ED Notes (Addendum)
IVC papers completed by Dr Denton LankSteinl - [facility] - confirmed receipt.

## 2017-09-09 NOTE — ED Notes (Signed)
Patient was given a snack and drink. 

## 2017-09-09 NOTE — ED Notes (Signed)
[facility] papers served - [facility], copy sent to Medical Records, original placed in folder for Magistrate, and all 3 copies on clipboard.

## 2017-09-09 NOTE — ED Notes (Signed)
ED Provider at bedside. 

## 2017-09-09 NOTE — Care Management (Signed)
Case management consult acknowledged.  [facility]

## 2017-09-10 MED ORDER — AMLODIPINE BESYLATE 5 MG PO TABS
5.0000 mg | ORAL_TABLET | Freq: Every day | ORAL | Status: DC
  Administered 2017-09-10 – 2017-09-18 (×9): 5 mg via ORAL
  Filled 2017-09-10 (×10): qty 1

## 2017-09-10 MED ORDER — CARBOXYMETHYLCELLULOSE SODIUM 0.25 % OP SOLN: 1.0000 [drp] | Freq: Four times a day (QID) | OPHTHALMIC | Status: DC | PRN

## 2017-09-10 MED ORDER — MELATONIN 3 MG PO TABS
6.0000 mg | ORAL_TABLET | Freq: Every day | ORAL | Status: DC
  Administered 2017-09-11 – 2017-09-18 (×8): 6 mg via ORAL
  Filled 2017-09-10 (×12): qty 2

## 2017-09-10 MED ORDER — CLOPIDOGREL BISULFATE 75 MG PO TABS
75.0000 mg | ORAL_TABLET | Freq: Every day | ORAL | Status: DC
  Administered 2017-09-10 – 2017-09-19 (×10): 75 mg via ORAL
  Filled 2017-09-10 (×10): qty 1

## 2017-09-10 MED ORDER — HYDROCHLOROTHIAZIDE 25 MG PO TABS
25.0000 mg | ORAL_TABLET | Freq: Every day | ORAL | Status: DC
  Administered 2017-09-10 – 2017-09-18 (×9): 25 mg via ORAL
  Filled 2017-09-10 (×10): qty 1

## 2017-09-10 MED ORDER — TRAZODONE HCL 100 MG PO TABS
100.0000 mg | ORAL_TABLET | Freq: Once | ORAL | Status: AC
  Administered 2017-09-10: 100 mg via ORAL
  Filled 2017-09-10: qty 1

## 2017-09-10 MED ORDER — POLYVINYL ALCOHOL 1.4 % OP SOLN
1.0000 [drp] | Freq: Four times a day (QID) | OPHTHALMIC | Status: DC | PRN
  Filled 2017-09-10: qty 15

## 2017-09-10 MED ORDER — DOCUSATE SODIUM 100 MG PO CAPS
100.0000 mg | ORAL_CAPSULE | Freq: Two times a day (BID) | ORAL | Status: DC
  Administered 2017-09-10 – 2017-09-19 (×18): 100 mg via ORAL
  Filled 2017-09-10 (×18): qty 1

## 2017-09-10 MED ORDER — FINASTERIDE 5 MG PO TABS
5.0000 mg | ORAL_TABLET | Freq: Every day | ORAL | Status: DC
  Administered 2017-09-10 – 2017-09-19 (×9): 5 mg via ORAL
  Filled 2017-09-10 (×11): qty 1

## 2017-09-10 MED ORDER — VITAMIN B-12 1000 MCG PO TABS
1000.0000 ug | ORAL_TABLET | Freq: Every day | ORAL | Status: DC
  Administered 2017-09-10 – 2017-09-19 (×10): 1000 ug via ORAL
  Filled 2017-09-10 (×11): qty 1

## 2017-09-10 MED ORDER — PRAMIPEXOLE DIHYDROCHLORIDE 0.25 MG PO TABS
0.2500 mg | ORAL_TABLET | Freq: Every day | ORAL | Status: DC
  Administered 2017-09-10 – 2017-09-18 (×9): 0.25 mg via ORAL
  Filled 2017-09-10 (×12): qty 1

## 2017-09-10 MED ORDER — BACLOFEN 10 MG PO TABS
15.0000 mg | ORAL_TABLET | Freq: Three times a day (TID) | ORAL | Status: DC | PRN
  Administered 2017-09-10 – 2017-09-12 (×2): 15 mg via ORAL
  Filled 2017-09-10: qty 2
  Filled 2017-09-10: qty 1
  Filled 2017-09-10 (×2): qty 3

## 2017-09-10 MED ORDER — MEMANTINE HCL 10 MG PO TABS
10.0000 mg | ORAL_TABLET | Freq: Two times a day (BID) | ORAL | Status: DC
  Administered 2017-09-10 – 2017-09-19 (×18): 10 mg via ORAL
  Filled 2017-09-10 (×19): qty 1

## 2017-09-10 MED ORDER — DONEPEZIL HCL 10 MG PO TABS
10.0000 mg | ORAL_TABLET | Freq: Every day | ORAL | Status: DC
  Administered 2017-09-10 – 2017-09-19 (×10): 10 mg via ORAL
  Filled 2017-09-10: qty 2
  Filled 2017-09-10 (×2): qty 1
  Filled 2017-09-10: qty 2
  Filled 2017-09-10 (×2): qty 1
  Filled 2017-09-10 (×5): qty 2

## 2017-09-10 MED ORDER — ATORVASTATIN CALCIUM 20 MG PO TABS
20.0000 mg | ORAL_TABLET | Freq: Every day | ORAL | Status: DC
  Administered 2017-09-10 – 2017-09-19 (×10): 20 mg via ORAL
  Filled 2017-09-10: qty 2
  Filled 2017-09-10 (×2): qty 1
  Filled 2017-09-10 (×2): qty 2
  Filled 2017-09-10 (×2): qty 1
  Filled 2017-09-10: qty 2
  Filled 2017-09-10: qty 1
  Filled 2017-09-10 (×2): qty 2

## 2017-09-10 MED ORDER — LEVETIRACETAM 500 MG PO TABS
500.0000 mg | ORAL_TABLET | Freq: Two times a day (BID) | ORAL | Status: DC
  Administered 2017-09-10 – 2017-09-19 (×18): 500 mg via ORAL
  Filled 2017-09-10 (×18): qty 1

## 2017-09-10 MED ORDER — SERTRALINE HCL 25 MG PO TABS
75.0000 mg | ORAL_TABLET | Freq: Every day | ORAL | Status: DC
  Administered 2017-09-10 – 2017-09-19 (×10): 75 mg via ORAL
  Filled 2017-09-10 (×12): qty 1

## 2017-09-10 MED ORDER — ZIPRASIDONE MESYLATE 20 MG IM SOLR
10.0000 mg | Freq: Once | INTRAMUSCULAR | Status: AC
  Administered 2017-09-10: 10 mg via INTRAMUSCULAR
  Filled 2017-09-10: qty 20

## 2017-09-10 MED ORDER — PANTOPRAZOLE SODIUM 40 MG PO TBEC
40.0000 mg | DELAYED_RELEASE_TABLET | Freq: Every day | ORAL | Status: DC
  Administered 2017-09-11 – 2017-09-19 (×8): 40 mg via ORAL
  Filled 2017-09-10 (×9): qty 1

## 2017-09-10 MED ORDER — ARIPIPRAZOLE 10 MG PO TABS
10.0000 mg | ORAL_TABLET | Freq: Every day | ORAL | Status: DC
  Administered 2017-09-10 – 2017-09-18 (×9): 10 mg via ORAL
  Filled 2017-09-10 (×10): qty 1

## 2017-09-10 NOTE — ED Notes (Signed)
Spoke with Dr.Knapp regarding getting Trazodone order and d/c Melatonin; Trazodone not found on home med list; Md not comfortable placing new order for Trazodone at this time; pt is in room calm at this time-Monique,RN

## 2017-09-10 NOTE — ED Notes (Signed)
Requested Ray[place] Citizenaroline, Pharmacy, to ask Kathie Dikeharm Tech to call daughter for med rec. Re-TTS completed this am.

## 2017-09-10 NOTE — ED Notes (Signed)
Pt aware his home meds have been ordered and RN will be administering soon. Pt voiced appreciation. States "I just want to make sure I get my Trazodone so I'll be able to sleep tonight".

## 2017-09-10 NOTE — ED Provider Notes (Addendum)
Notified that home meds are now in the system.  Home meds ordered.   Noah Lee, Noah Partin, MD 09/10/17 [phone]   Notified that pt is becoming agitated tonight.  He is pacing, asking to talk to security and demanding to leave.  Pt is on IVC.  Will give geodon 10 mg im        Noah Lee, Noah Thurlow, MD 09/10/17 2059

## 2017-09-10 NOTE — ED Notes (Signed)
Per Diplomatic Services operational officersecretary, daughter called and is aware of visitation times - requested for her to bring pt's med list and legal guardian paperwork.

## 2017-09-10 NOTE — ED Notes (Signed)
Son advised pt has had dementia for a while but it appears to have gotten worse very quickly. States he is in agreement w/tx plan - seeking inpt Geri-Psych tx. States his sister, Orpha BurKaty, is pt's legal guardian and that she will be bringing a copy of paperwork tomorrow.

## 2017-09-10 NOTE — ED Notes (Signed)
Pt is now calm sitting in chair at door way; Pt took night medications with out any problems-Monique,RN

## 2017-09-10 NOTE — ED Notes (Signed)
Security at bedside due to pt request; pt began to increase in agitation and demanding to go home; pt was advised that he is unable to leave but pt demanded to speak with officer because he does not want to stay; pt is non-violent but has continues move towards the door as if he is leaving; MD notified and new order placed-Monique,RN

## 2017-09-10 NOTE — ED Notes (Signed)
Noah Lee, sister, [phone] - called and advised [place] but states she is happy to help in anyway w/pt.

## 2017-09-10 NOTE — ED Notes (Signed)
Spoke w/pt's daughter, Vira BlancoKathryn Hazen - [phone]- [phone] - states she is pt's legal guardian and is planning to get a copy of guardianship papers tomorrow (09/11/17) from [facility] [address]. States she is also his fudiciary (sp?). [facility], so may receive clarification on info as pt has dementia. Daughter given phone numbers to call Pharm Tech so Med Rec may be performed - states will call them today d/t she is planning to come to visit w/pt tomorrow.

## 2017-09-10 NOTE — ED Notes (Signed)
Pharmacy Tech reported this am that they have tried to reach pt's daughter - no answer at either number listed. This RN attempted to reach her - no answer at all 3 numbers attempted. Pt states he does not know his daughter's phone number.

## 2017-09-10 NOTE — ED Notes (Signed)
Eating snack given - dinner order request received.

## 2017-09-10 NOTE — ED Notes (Signed)
Son visiting w/pt.  

## 2017-09-10 NOTE — BHH Counselor (Signed)
Clinician contacted pt to inquire as to how he has been doing. Pt stated he was in another program but that he was moved into this area of the hospital because he "threw some tantrums" last week. Pt expressed hoping he would be able to move back to his regular program, as he feels things are currently going well. Pt states he feels good and states he slept very well last night. Pt shares he has been eating more than he should but was unable to recall what he ate for dinner last night. Pt denied SI, HI, or AVH.

## 2017-09-11 NOTE — ED Triage Notes (Signed)
Pt standing at sink voiding into sink and on floor.

## 2017-09-11 NOTE — ED Notes (Signed)
Regular Diet ordered for Dinner. 

## 2017-09-11 NOTE — ED Notes (Addendum)
Pt found urinating in corner of room; pt's bed was soaked in urine; pt bedding change, changed scrubs and taken to shower-Monique,RN

## 2017-09-12 ENCOUNTER — Emergency Department (HOSPITAL_COMMUNITY): Payer: Medicare Other

## 2017-09-12 MED ORDER — TRAZODONE HCL 100 MG PO TABS
100.0000 mg | ORAL_TABLET | Freq: Once | ORAL | Status: AC
  Administered 2017-09-12: 100 mg via ORAL
  Filled 2017-09-12: qty 1
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
## 2017-09-12 NOTE — ED Notes (Signed)
relieved sitter for break Pt asleep at this time,  area all secure.

## 2017-09-12 NOTE — Progress Notes (Signed)
Disposition CSW received call from [employer] DepotKierra W. LCSW, [facility] CSW asking about status of patient as he was no reassessed yesterday.  There were a number of reassessments completed and apparently his was missed.  This Clinical research associatewriter notified TTS and he will be seen this morning.  Timmothy EulerJean T. Kaylyn LimSutter, MSW, LCSWA Disposition Clinical Social Work [phone] (cell) [phone] (office)

## 2017-09-12 NOTE — ED Notes (Signed)
Regular breakfast tray ordered.  

## 2017-09-12 NOTE — ED Notes (Signed)
Pt advised he needs to empty his bladder. Pt then laid down on bed stating he cannot walk. RN and Sitter assisted pt w/ambulating to bathroom and back to room. Pt did not void. States he does not recall ambulating to bathroom earlier today x 2. Pt able to move all extremities w/o difficulty.

## 2017-09-12 NOTE — ED Notes (Signed)
Assisted pt w/tv and elevated head of bed for comfort. Pt ambulatory to nurses' desk x 2 - asking for RN to assist him w/finding "porn stars" on tv. RN attempted to clarify - "pawn stars" - pt states repeatedly - "port stars".

## 2017-09-12 NOTE — Progress Notes (Addendum)
Pt. meets criteria for inpatient treatment per Assunta FoundShuvon Rankin, NP.  Referred out to the following additional hospitals:  [facility]-At capacity on 2/26 [facility]At capacity on 2/26 [facility]-Pt is IVC'd and we can;'t take him.  Disposition CSW will continue to follow for placement.  Timmothy EulerJean T. Kaylyn LimSutter, MSW, LCSWA Disposition Clinical Social Work [phone] (cell) [phone] (office)

## 2017-09-12 NOTE — ED Notes (Signed)
Pt noted to be restless - continues to get up from bed then sits back down on bed. Pt states "they keep wanting me to say porn stars". When asked who - states "the people here". Placed in chair in room so pt may sit in chair for a while rather than on the bed. Offered pt snack - declined x 2. Offered pt to shower x2 - declined.

## 2017-09-12 NOTE — ED Provider Notes (Signed)
Patient acted weaker than usual this morning.  Patient has a known history of dementia with behavioral disturbances.  No obvious focal neuro deficit.  Patient waiting geriatric psych placement.  But based on this general concern of overall weakness head CT was done without any acute findings.   Noah Lee, Noah Dehner, MD 09/12/17 [phone]

## 2017-09-12 NOTE — ED Notes (Signed)
Pt has ambulated to nurses' desk multiple times asking for RN to come talk w/him. Advised pt RN is giving report at this time but will be in to speak w/him soon. Pt returns to room then repeats same. Sitter assisting.

## 2017-09-12 NOTE — ED Notes (Signed)
Dr Deretha EmoryZackowski reviewed pt's CT - unremarkable - no new orders received.

## 2017-09-12 NOTE — ED Notes (Signed)
RN dialed phone for pt to talk w/his daughter, Orpha BurKaty. Pt states she advised she is coming to visit pt tomorrow. Pt returned to room.

## 2017-09-12 NOTE — ED Notes (Addendum)
Pt sat in floor in room beside of bed. Encouraged pt to sit back on bed rather than floor - states "I'm trying". Pt noted to be holding up his arms bent at the elbows and out in front of him. Pt noted w/his eyes closed. Pt will open his eyes when asked to do so but then closes them back. Security assisting w/placing pt back in bed in comfortable position. Pt aware he is still in hospital - states "I am here because I'm crazy". Dr Deretha EmoryZackowski aware of change in pt's behavior.

## 2017-09-13 ENCOUNTER — Encounter (HOSPITAL_COMMUNITY): Payer: Self-pay | Admitting: Registered Nurse

## 2017-09-13 DIAGNOSIS — F03918 Unspecified dementia, unspecified severity, with other behavioral disturbance: Secondary | ICD-10-CM

## 2017-09-13 DIAGNOSIS — Z87891 Personal history of nicotine dependence: Secondary | ICD-10-CM

## 2017-09-13 DIAGNOSIS — F0391 Unspecified dementia with behavioral disturbance: Secondary | ICD-10-CM

## 2017-09-13 DIAGNOSIS — R45851 Suicidal ideations: Secondary | ICD-10-CM

## 2017-09-13 NOTE — ED Notes (Signed)
Patient sleeping at this time. Sitter at door.
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
## 2017-09-13 NOTE — ED Notes (Signed)
Patient up to walk to desk. Patient walks with small shuffle steps and does not seem stable while walking. Patient stated he wanted to leave. Patient redirected and placed back into the bed.

## 2017-09-13 NOTE — ED Notes (Signed)
Woke patient up. Patient has no complaints at this time.

## 2017-09-14 ENCOUNTER — Emergency Department (HOSPITAL_COMMUNITY): Payer: Medicare Other

## 2017-09-14 LAB — CBC WITH DIFFERENTIAL/PLATELET
BASOS ABS: 0 10*3/uL (ref 0.0–0.1)
BASOS PCT: 0 %
EOS ABS: 0.1 10*3/uL (ref 0.0–0.7)
Eosinophils Relative: 0 %
HCT: 42.8 % (ref 39.0–52.0)
HEMOGLOBIN: 14.4 g/dL (ref 13.0–17.0)
Lymphocytes Relative: 13 %
Lymphs Abs: 1.8 10*3/uL (ref 0.7–4.0)
MCH: 31.7 pg (ref 26.0–34.0)
MCHC: 33.6 g/dL (ref 30.0–36.0)
MCV: 94.3 fL (ref 78.0–100.0)
MONO ABS: 1 10*3/uL (ref 0.1–1.0)
MONOS PCT: 7 %
NEUTROS PCT: 80 %
Neutro Abs: 11.3 10*3/uL — ABNORMAL HIGH (ref 1.7–7.7)
Platelets: 339 10*3/uL (ref 150–400)
RBC: 4.54 MIL/uL (ref 4.22–5.81)
RDW: 13.5 % (ref 11.5–15.5)
WBC: 14.1 10*3/uL — ABNORMAL HIGH (ref 4.0–10.5)

## 2017-09-14 NOTE — ED Notes (Signed)
Dinner Tray ordered 

## 2017-09-14 NOTE — [facility] Counselor (Signed)
Pt denies SI/HI and AVH.  Pt reports SI attempt 2 months ago.  Shuvon, NP continues to recommend inpatient treatment.  Wolfgang PhoenixBrandi Javonn Gauger, [facility] Triage Specialist

## 2017-09-14 NOTE — ED Notes (Signed)
Pt requested help with meal. Sitter helping him

## 2017-09-14 NOTE — ED Notes (Signed)
Per conversation w/ tech whom cared for pt when he first arrived, pt was ambulatory and conversational, requiring frequent redirection. Upon assuming care for pt, pt has been sleeping since 1900, lethargic at times. Previous RN reports pt is unable to ambulate independently and is largely unsteady on feet w/ staggering gait. Pt also w/ productive cough bringing up moderate amounts yellow sputum. Pt w/ LS diminished in based, coarse throughout. Afebrile on oral temp, however does feel warm to touch. MD Jeraldine LootsLockwood alerted to findings, plan for repeat CXR and basic labwork. At this time, pt denies subjective complaints.

## 2017-09-14 NOTE — ED Notes (Signed)
Pt coughing up thick dark yellow flem

## 2017-09-14 NOTE — ED Notes (Signed)
Complete bed change provided to pt, pt incontinent of large amount of urine. Brief and barrier cream applied. Pt turned and repositioned. Noted darkened purple/reddened area to coccyx w/ minimal open area. Allevyn applied for skin protection.

## 2017-09-14 NOTE — ED Notes (Signed)
TTS done 

## 2017-09-14 NOTE — ED Notes (Signed)
Pt's dinner tray arrived. 

## 2017-09-14 NOTE — Progress Notes (Signed)
Pt. Continues to meet criteria for inpatient treatment per Assunta FoundShuvon Rankin, NP.  Referred out to the following hospitals:  [facility]     [facility]  [facility]  [facility]  [facility]  [facility]  [facility]          Disposition CSW will continue to follow for placement.  Timmothy EulerJean T. Kaylyn LimSutter, MSW, LCSWA Disposition Clinical Social Work [phone] (cell) [phone] (office)

## 2017-09-15 ENCOUNTER — Encounter (HOSPITAL_COMMUNITY): Payer: Self-pay | Admitting: Internal Medicine

## 2017-09-15 DIAGNOSIS — B349 Viral infection, unspecified: Secondary | ICD-10-CM | POA: Diagnosis present

## 2017-09-15 DIAGNOSIS — L8990 Pressure ulcer of unspecified site, unspecified stage: Secondary | ICD-10-CM | POA: Diagnosis present

## 2017-09-15 DIAGNOSIS — E7800 Pure hypercholesterolemia, unspecified: Secondary | ICD-10-CM | POA: Diagnosis present

## 2017-09-15 DIAGNOSIS — R05 Cough: Secondary | ICD-10-CM | POA: Diagnosis present

## 2017-09-15 DIAGNOSIS — R45851 Suicidal ideations: Secondary | ICD-10-CM | POA: Diagnosis present

## 2017-09-15 DIAGNOSIS — F05 Delirium due to known physiological condition: Secondary | ICD-10-CM | POA: Diagnosis present

## 2017-09-15 DIAGNOSIS — G40909 Epilepsy, unspecified, not intractable, without status epilepticus: Secondary | ICD-10-CM | POA: Diagnosis not present

## 2017-09-15 DIAGNOSIS — R4587 Impulsiveness: Secondary | ICD-10-CM

## 2017-09-15 DIAGNOSIS — D72829 Elevated white blood cell count, unspecified: Secondary | ICD-10-CM | POA: Diagnosis present

## 2017-09-15 DIAGNOSIS — I10 Essential (primary) hypertension: Secondary | ICD-10-CM

## 2017-09-15 DIAGNOSIS — G2581 Restless legs syndrome: Secondary | ICD-10-CM | POA: Diagnosis present

## 2017-09-15 DIAGNOSIS — Z7902 Long term (current) use of antithrombotics/antiplatelets: Secondary | ICD-10-CM | POA: Diagnosis not present

## 2017-09-15 DIAGNOSIS — Z9049 Acquired absence of other specified parts of digestive tract: Secondary | ICD-10-CM | POA: Diagnosis not present

## 2017-09-15 DIAGNOSIS — Z8673 Personal history of transient ischemic attack (TIA), and cerebral infarction without residual deficits: Secondary | ICD-10-CM | POA: Diagnosis not present

## 2017-09-15 DIAGNOSIS — F329 Major depressive disorder, single episode, unspecified: Secondary | ICD-10-CM | POA: Diagnosis present

## 2017-09-15 DIAGNOSIS — R509 Fever, unspecified: Secondary | ICD-10-CM | POA: Diagnosis present

## 2017-09-15 DIAGNOSIS — F0391 Unspecified dementia with behavioral disturbance: Secondary | ICD-10-CM | POA: Diagnosis not present

## 2017-09-15 DIAGNOSIS — Z915 Personal history of self-harm: Secondary | ICD-10-CM | POA: Diagnosis not present

## 2017-09-15 DIAGNOSIS — Z87891 Personal history of nicotine dependence: Secondary | ICD-10-CM | POA: Diagnosis not present

## 2017-09-15 DIAGNOSIS — E876 Hypokalemia: Secondary | ICD-10-CM | POA: Diagnosis present

## 2017-09-15 DIAGNOSIS — Z8249 Family history of ischemic heart disease and other diseases of the circulatory system: Secondary | ICD-10-CM | POA: Diagnosis not present

## 2017-09-15 DIAGNOSIS — Z79899 Other long term (current) drug therapy: Secondary | ICD-10-CM | POA: Diagnosis not present

## 2017-09-15 DIAGNOSIS — K219 Gastro-esophageal reflux disease without esophagitis: Secondary | ICD-10-CM | POA: Diagnosis present

## 2017-09-15 DIAGNOSIS — E785 Hyperlipidemia, unspecified: Secondary | ICD-10-CM | POA: Diagnosis present

## 2017-09-15 DIAGNOSIS — Z888 Allergy status to other drugs, medicaments and biological substances status: Secondary | ICD-10-CM | POA: Diagnosis not present

## 2017-09-15 DIAGNOSIS — N400 Enlarged prostate without lower urinary tract symptoms: Secondary | ICD-10-CM | POA: Diagnosis present

## 2017-09-15 LAB — COMPREHENSIVE METABOLIC PANEL
ALBUMIN: 3.6 g/dL (ref 3.5–5.0)
ALT: 11 U/L — ABNORMAL LOW (ref 17–63)
AST: 24 U/L (ref 15–41)
Alkaline Phosphatase: 59 U/L (ref 38–126)
Anion gap: 14 (ref 5–15)
BUN: 18 mg/dL (ref 6–20)
CO2: 22 mmol/L (ref 22–32)
Calcium: 8.8 mg/dL — ABNORMAL LOW (ref 8.9–10.3)
Chloride: 98 mmol/L — ABNORMAL LOW (ref 101–111)
Creatinine, Ser: 0.83 mg/dL (ref 0.61–1.24)
GFR calc Af Amer: >60 mL/min (ref >60)
GFR calc non Af Amer: >60 mL/min (ref >60)
GLUCOSE: 100 mg/dL — AB (ref 65–99)
POTASSIUM: 3.1 mmol/L — AB (ref 3.5–5.1)
SODIUM: 134 mmol/L — AB (ref 135–145)
Total Bilirubin: 1.4 mg/dL — ABNORMAL HIGH (ref 0.3–1.2)
Total Protein: 6.4 g/dL — ABNORMAL LOW (ref 6.5–8.1)

## 2017-09-15 LAB — CBC
HCT: 40.5 % (ref 39.0–52.0)
Hemoglobin: 13.4 g/dL (ref 13.0–17.0)
MCH: 31.2 pg (ref 26.0–34.0)
MCHC: 33.1 g/dL (ref 30.0–36.0)
MCV: 94.2 fL (ref 78.0–100.0)
Platelets: 309 K/uL (ref 150–400)
RBC: 4.3 MIL/uL (ref 4.22–5.81)
RDW: 13.5 % (ref 11.5–15.5)
WBC: 13.5 K/uL — ABNORMAL HIGH (ref 4.0–10.5)

## 2017-09-15 LAB — CREATININE, SERUM
Creatinine, Ser: 0.91 mg/dL (ref 0.61–1.24)
GFR calc Af Amer: >60 mL/min (ref >60)
GFR calc non Af Amer: >60 mL/min (ref >60)

## 2017-09-15 LAB — I-STAT CG4 LACTIC ACID, ED: LACTIC ACID, VENOUS: 0.81 mmol/L (ref 0.5–1.9)

## 2017-09-15 LAB — URINALYSIS, ROUTINE W REFLEX MICROSCOPIC
Bilirubin Urine: NEGATIVE
GLUCOSE, UA: NEGATIVE mg/dL
Hgb urine dipstick: NEGATIVE
Ketones, ur: NEGATIVE mg/dL
LEUKOCYTES UA: NEGATIVE
Nitrite: NEGATIVE
PH: 5 (ref 5.0–8.0)
Protein, ur: NEGATIVE mg/dL
SPECIFIC GRAVITY, URINE: 1.019 (ref 1.005–1.030)

## 2017-09-15 LAB — STREP PNEUMONIAE URINARY ANTIGEN: Strep Pneumo Urinary Antigen: NEGATIVE

## 2017-09-15 LAB — INFLUENZA PANEL BY PCR (TYPE A & B)
Influenza A By PCR: NEGATIVE
Influenza B By PCR: NEGATIVE

## 2017-09-15 MED ORDER — POTASSIUM CHLORIDE CRYS ER 20 MEQ PO TBCR
40.0000 meq | EXTENDED_RELEASE_TABLET | Freq: Once | ORAL | Status: AC
  Administered 2017-09-15: 40 meq via ORAL
  Filled 2017-09-15: qty 2

## 2017-09-15 MED ORDER — ACETAMINOPHEN 325 MG PO TABS
650.0000 mg | ORAL_TABLET | Freq: Four times a day (QID) | ORAL | Status: DC | PRN
  Filled 2017-09-15: qty 2

## 2017-09-15 MED ORDER — ACETAMINOPHEN 650 MG RE SUPP
650.0000 mg | Freq: Four times a day (QID) | RECTAL | Status: DC | PRN
Start: 2017-09-15 — End: 2017-09-19

## 2017-09-15 MED ORDER — SODIUM CHLORIDE 0.9 % IV SOLN
INTRAVENOUS | Status: DC
  Administered 2017-09-15: 04:00:00 via INTRAVENOUS

## 2017-09-15 MED ORDER — PIPERACILLIN-TAZOBACTAM 3.375 G IVPB 30 MIN
3.3750 g | Freq: Once | INTRAVENOUS | Status: AC
  Administered 2017-09-15: 3.375 g via INTRAVENOUS
  Filled 2017-09-15: qty 50

## 2017-09-15 MED ORDER — ONDANSETRON HCL 4 MG/2ML IJ SOLN: 4.0000 mg | Freq: Four times a day (QID) | INTRAMUSCULAR | Status: DC | PRN

## 2017-09-15 MED ORDER — VANCOMYCIN HCL IN DEXTROSE 1-5 GM/200ML-% IV SOLN
1000.0000 mg | Freq: Once | INTRAVENOUS | Status: AC
  Administered 2017-09-15: 1000 mg via INTRAVENOUS
  Filled 2017-09-15: qty 200

## 2017-09-15 MED ORDER — SODIUM CHLORIDE 0.9 % IV SOLN
1250.0000 mg | Freq: Two times a day (BID) | INTRAVENOUS | Status: DC
  Administered 2017-09-15 – 2017-09-17 (×5): 1250 mg via INTRAVENOUS
  Filled 2017-09-15 (×7): qty 1250

## 2017-09-15 MED ORDER — ACETAMINOPHEN 500 MG PO TABS
1000.0000 mg | ORAL_TABLET | Freq: Once | ORAL | Status: AC
Start: 2017-09-15 — End: 2017-09-15
  Administered 2017-09-15: 1000 mg via ORAL
  Filled 2017-09-15: qty 2

## 2017-09-15 MED ORDER — ONDANSETRON HCL 4 MG PO TABS
4.0000 mg | ORAL_TABLET | Freq: Four times a day (QID) | ORAL | Status: DC | PRN
Start: 2017-09-15 — End: 2017-09-19

## 2017-09-15 MED ORDER — SODIUM CHLORIDE 0.9 % IV SOLN
1.0000 g | Freq: Three times a day (TID) | INTRAVENOUS | Status: DC
  Administered 2017-09-15 – 2017-09-17 (×7): 1 g via INTRAVENOUS
  Filled 2017-09-15 (×10): qty 1

## 2017-09-15 MED ORDER — ENOXAPARIN SODIUM 60 MG/0.6ML ~~LOC~~ SOLN
55.0000 mg | SUBCUTANEOUS | Status: DC
  Administered 2017-09-16 – 2017-09-17 (×2): 55 mg via SUBCUTANEOUS
  Filled 2017-09-15 (×3): qty 0.6

## 2017-09-15 MED ORDER — ENOXAPARIN SODIUM 40 MG/0.4ML ~~LOC~~ SOLN
40.0000 mg | SUBCUTANEOUS | Status: DC
  Filled 2017-09-15: qty 0.4

## 2017-09-15 NOTE — ED Notes (Signed)
Patient up to bedside commode with one person assist for bowel movement.

## 2017-09-15 NOTE — Progress Notes (Signed)
Pharmacy Antibiotic Note  Noah Lee is a 72 y.o. male admitted on 09/08/2017 with pneumonia.  Pharmacy has been consulted for Vancomycin dosing. WBC elevated. Renal function good.   Plan: Vancomycin 1250 mg IV q12h Cefepime per MD Trend WBC, temp, renal function  F/U infectious work-up Drug levels as indicated   Height: 5\' 11"  (180.3 cm) Weight: 250 lb (113.4 kg) IBW/kg (Calculated) : 75.3  Temp (24hrs), Avg:98.9 F (37.2 C), Min:98.1 F (36.7 C), Max:100.5 F (38.1 C)  Recent Labs  Lab 09/09/17 0437 09/14/17 2332 09/15/17 0220  WBC 7.5 14.1*  --   CREATININE 0.82 0.83  --   LATICACIDVEN  --   --  0.81    Estimated Creatinine Clearance: 106 mL/min (by C-G formula based on SCr of 0.83 mg/dL).    Allergies  Allergen Reactions  . Ambien [Zolpidem Tartrate] Other (See Comments)    Sleep walking   Abran DukeLedford, Noah Lee 09/15/2017 5:58 AM
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
## 2017-09-15 NOTE — ED Provider Notes (Signed)
2:00 AM  Pt is a [age] year old male that was in the psych holding area waiting for geri psychiatric placement.  Told by nursing staff that patient now seemed weaker and had a low-grade oral temperatures.  Rectal temp of 100.5.  Labs show leukocytosis of 14,000 with left shift.  Will obtain lactate, blood cultures, repeat urinalysis.  Chest x-ray clear but patient has a productive cough.  No hypoxia.  For me he is oriented x3 and denies headache, chest pain, abdominal pain.  No recent vomiting or diarrhea.  No rash or skin changes.  3:00 AM  Pt's lactate is normal.  Urine shows no sign of infection.  Flu swab negative.  Chest x-ray again is clear.  No obvious source but given fever with leukocytosis with left shift and elderly patient that is increasingly weak, will give broad-spectrum antibiotics and admit.  3:37 AM Discussed patient's case with hospitalist, Dr. Toniann FailKakrakandy.  I have recommended admission and patient (and family if present) agree with this plan. Admitting physician will place admission orders.   I reviewed all nursing notes, vitals, pertinent previous records, EKGs, lab and urine results, imaging (as available).     Ward, Layla MawKristen N, DO 09/15/17 [phone]

## 2017-09-15 NOTE — ED Notes (Signed)
Nurse drawing labs. 

## 2017-09-15 NOTE — H&P (Signed)
History and Physical    Noah Lee:[id number] DOB: Mar 04, 1946 DOA: 09/08/2017  PCP: Patient, No Pcp Per  Patient coming from: Home.  Chief Complaint: Fever.  HPI: Noah Lee is a 72 y.o. male with history of dementia with behavioral disturbances who was brought to the ER on February 23 with agitation and behavioral disturbances was placed on involuntary commitment and is to be placed in geriatric dementia unit and had just received the placement when patient started having fever and chills.  ER physician also noted that patient was having productive cough.  Chest x-ray UA and influenza PCR came negative.  Lab work showed WBC of 14,000.  Given these symptoms and lab work patient will need to be further observed before patient being discharged to geriatric psychiatric unit.  On my exam patient is not in distress.  He is oriented to his name and place.  Denies any pain or shortness of breath.  ED Course: As in history of present illness.   Review of Systems: As per HPI, rest all negative.   Past Medical History:  Diagnosis Date  . Benign prostatic hyperplasia   . Cerebral aneurysm   . GERD (gastroesophageal reflux disease)   . High cholesterol   . Hypertension   . Pneumonia   . Restless leg syndrome   . Seizure disorder (HCC)   . Seizures (HCC)   . Stroke [facility])     Past Surgical History:  Procedure Laterality Date  . CEREBRAL ANEURYSM REPAIR    . CEREBRAL ANEURYSM REPAIR    . CHOLECYSTECTOMY    . KNEE ARTHROPLASTY       reports that he has quit smoking. he has never used smokeless tobacco. He reports that he drinks alcohol. He reports that he does not use drugs.  Allergies  Allergen Reactions  . Ambien [Zolpidem Tartrate] Other (See Comments)    Sleep walking    Family History  Problem Relation Age of Onset  . Hypertension Other     Prior to Admission medications   Medication Sig Start Date End Date Taking? Authorizing Provider  amLODipine  (NORVASC) 5 MG tablet Take 5 mg by mouth daily.   Yes [provider]  ARIPiprazole (ABILIFY) 10 MG tablet Take 10 mg by mouth at bedtime.   Yes [provider]  atorvastatin (LIPITOR) 20 MG tablet Take 20 mg by mouth daily.    Yes [provider]  baclofen (LIORESAL) 10 MG tablet Take 15 mg by mouth 3 (three) times daily as needed for muscle spasms (restless legs).   Yes [provider]  Carboxymethylcellulose Sodium 0.25 % SOLN Place 1 drop into both eyes 4 (four) times daily as needed (dry eyes).   Yes [provider]  clopidogrel (PLAVIX) 75 MG tablet Take 75 mg by mouth daily.   Yes [provider]  cyanocobalamin 500 MCG tablet Take 1,000 mcg by mouth daily.   Yes [provider]  docusate sodium (COLACE) 100 MG capsule Take 100 mg by mouth 2 (two) times daily.   Yes [provider]  donepezil (ARICEPT) 10 MG tablet Take 10 mg by mouth daily.   Yes [provider]  finasteride (PROSCAR) 5 MG tablet Take 5 mg by mouth daily.    Yes [provider]  hydrochlorothiazide (HYDRODIURIL) 25 MG tablet Take 25 mg by mouth daily.   Yes [provider]  levETIRAcetam (KEPPRA) 500 MG tablet Take 500 mg by mouth 2 (two) times daily. For  seizures   Yes [provider]  Melatonin 3 MG TABS Take 6 mg by mouth at bedtime.   Yes [provider]  memantine (NAMENDA) 10 MG tablet Take 10 mg by mouth 2 (two) times daily.   Yes [provider]  MENTHOL-METHYL SALICYLATE EX Apply 1 application topically 3 (three) times daily as needed (knee and joint pain and inflammation).   Yes [provider]  pantoprazole (PROTONIX) 40 MG tablet Take 40 mg by mouth daily before breakfast.   Yes [provider]  pramipexole (MIRAPEX) 0.25 MG tablet Take 0.25 mg by mouth at bedtime.   Yes [provider]  sertraline (ZOLOFT) 50 MG tablet Take 75 mg by mouth daily.   Yes [provider]  aspirin EC 81 MG EC tablet Take 1 tablet (81 mg total) by mouth daily. Patient not taking: Reported on 09/10/2017 07/19/12   Street, Stephanie Couphristopher M, MD    Physical Exam: Vitals:   09/15/17 0400 09/15/17 0408 09/15/17 0434 09/15/17 0500  BP: 109/60  115/63 109/61  Pulse: 65  62 65  Resp: 18  14 14   Temp:  98.6 F (37 C)    TempSrc:  Oral    SpO2: 98%  98% 97%  Weight:      Height:          Constitutional: Moderately built and nourished. Vitals:   09/15/17 0400 09/15/17 0408 09/15/17 0434 09/15/17 0500  BP: 109/60  115/63 109/61  Pulse: 65  62 65  Resp: 18  14 14   Temp:  98.6 F (37 C)    TempSrc:  Oral    SpO2: 98%  98% 97%  Weight:      Height:       Eyes: Anicteric no pallor. ENMT: No discharge from the ears eyes nose or mouth. Neck: No mass felt.  No neck rigidity. Respiratory: No rhonchi or crepitations. Cardiovascular: S1-S2 heard no murmurs appreciated. Abdomen: Soft nontender bowel sounds present. Musculoskeletal: No edema.  No joint effusion. Skin: No rash.  Skin appears warm. Neurologic: Alert awake oriented to place and person.  Moves all extremities. Psychiatric: Patient at this time is not agitated.   Labs on Admission: I have personally reviewed following labs and imaging studies  CBC: Recent Labs  Lab 09/09/17 0437 09/14/17 2332  WBC 7.5 14.1*  NEUTROABS 4.0 11.3*  HGB 15.0 14.4  HCT 45.5 42.8  MCV 94.4 94.3  PLT 369 339   Basic Metabolic Panel: Recent Labs  Lab 09/09/17 0437 09/14/17 2332  NA 137 134*  K 3.7 3.1*  CL 102 98*  CO2 22 22  GLUCOSE 117* 100*  BUN 12 18  CREATININE 0.82 0.83  CALCIUM 9.0 8.8*   GFR: Estimated Creatinine Clearance: 106 mL/min (by C-G formula based on SCr of 0.83 mg/dL). Liver Function Tests: Recent Labs  Lab 09/14/17 2332  AST 24  ALT 11*  ALKPHOS 59  BILITOT 1.4*  PROT 6.4*  ALBUMIN 3.6   No results for input(s): LIPASE, AMYLASE in the last 168 hours. No results for input(s):  AMMONIA in the last 168 hours. Coagulation Profile: No results for input(s): INR, PROTIME in the last 168 hours. Cardiac Enzymes: No results for input(s): CKTOTAL, CKMB, CKMBINDEX, TROPONINI in the last 168 hours. BNP (last 3 results) No results for input(s): PROBNP in the last 8760 hours. HbA1C: No results for input(s): HGBA1C in the last 72 hours. CBG: No results for input(s): GLUCAP in the last 168 hours. Lipid Profile:  No results for input(s): CHOL, HDL, LDLCALC, TRIG, CHOLHDL, LDLDIRECT in the last 72 hours. Thyroid Function Tests: No results for input(s): TSH, T4TOTAL, FREET4, T3FREE, THYROIDAB in the last 72 hours. Anemia Panel: No results for input(s): VITAMINB12, FOLATE, FERRITIN, TIBC, IRON, RETICCTPCT in the last 72 hours. Urine analysis:    Component Value Date/Time   COLORURINE YELLOW 09/15/2017 0120   APPEARANCEUR CLEAR 09/15/2017 0120   LABSPEC 1.019 09/15/2017 0120   PHURINE 5.0 09/15/2017 0120   GLUCOSEU NEGATIVE 09/15/2017 0120   HGBUR NEGATIVE 09/15/2017 0120   BILIRUBINUR NEGATIVE 09/15/2017 0120   KETONESUR NEGATIVE 09/15/2017 0120   PROTEINUR NEGATIVE 09/15/2017 0120   UROBILINOGEN 0.2 07/16/2012 2320   NITRITE NEGATIVE 09/15/2017 0120   LEUKOCYTESUR NEGATIVE 09/15/2017 0120   Sepsis Labs: @LABRCNTIP (procalcitonin:4,lacticidven:4) )No results found for this or any previous visit (from the past 240 hour(s)).   Radiological Exams on Admission: Dg Chest Port 1 View  Result Date: 09/14/2017 CLINICAL DATA:  72 year old male with cough. EXAM: PORTABLE CHEST 1 VIEW COMPARISON:  Chest radiograph dated 09/09/2017 FINDINGS: The heart size and mediastinal contours are within normal limits. Both lungs are clear. The visualized skeletal structures are unremarkable. IMPRESSION: No active disease. Electronically Signed   By: Elgie Collard M.D.   On: 09/14/2017 22:37    Assessment/Plan Principal Problem:   Dementia with behavioral disturbance Active Problems:    Seizure disorder (HCC)   BPH (benign prostatic hyperplasia)   HTN (hypertension)   Fever   Febrile illness    1. Febrile illness -blood cultures have been ordered.  Since patient has productive cough as observed by ER physician will get sputum for cultures check urine for Legionella strep antigen.  Patient is empirically placed on vancomycin and cefepime. 2. History of seizure disorder on Keppra. 3. History of hypertension we will continue present home medications including amlodipine and hydrochlorothiazide. 4. Hyperlipidemia on statins. 5. History of brain aneurysm coiling -denies any headache and has had recent CT head which was unremarkable. 6. History of dementia with behavioral disturbances on Namenda and Aricept and patient is also on Abilify and Zoloft.   DVT prophylaxis: Lovenox. Code Status: Full code. Family Communication: No family at the bedside. Disposition Plan: We will need placement in a geriatric psychiatric unit.  Patient is already received a unit. Consults called: None. Admission status: Observation.   Eduard Clos MD [facility] Pager [phone].  If 7PM-7AM, please contact night-coverage [email or web address] Password [facility]  09/15/2017, 5:55 AM

## 2017-09-15 NOTE — ED Notes (Signed)
Lunch tray ordered; regular diet 

## 2017-09-15 NOTE — Progress Notes (Signed)
Patient admitted this am with acute Febrile illness and Leucocytosis. Blood cultures ordered and empirically on Vancomycin and Cefepime pending results. Has Hypokalemia and Pressure ulcer which may also be source of fever. Has some cough this am. Will repeat CXR after hydration in case of early pneumonia not showing up in initial X ray. Will also replete potassium and get Wound care. Patient is stable despite Dementia.

## 2017-09-15 NOTE — B[facility] Counselor (Signed)
Patient has been referred for a psychiatric consult with Dr. Norman. 

## 2017-09-15 NOTE — ED Notes (Signed)
[facility] [employer]

## 2017-09-15 NOTE — Progress Notes (Signed)
Noah FanningDennis W Lee is a 72 y.o. male patient admitted from ED awake, alert - oriented  X 4 - no acute distress noted.  VSS - Blood pressure (!) 95/58, pulse 63, temperature 98.3 F (36.8 C), temperature source Oral, resp. rate 17, height 5\' 11"  (1.803 m), weight 113.4 kg (250 lb), SpO2 98 %.    IV in place, occlusive dsg intact without redness.  Orientation to room, and floor completed with information packet given to patient/family.  Patient declined safety video at this time.  Admission INP armband ID verified with patient/family, and in place.   SR up x 2, fall assessment complete, with patient and family able to verbalize understanding of risk associated with falls, and verbalized understanding to call nsg before up out of bed.  Call light within reach, patient able to voice, and demonstrate understanding.  Skin, clean-dry- intact without evidence of bruising, or skin tears.   No evidence of skin break down noted on exam.     Will cont to eval and treat per MD orders.  Marca AnconaLaura M Corky Blumstein, RN 09/15/2017 2:24 PM

## 2017-09-15 NOTE — ED Notes (Signed)
Patient complaining of irritation to nasal passages while wearing nasal canula. Nasal canula removed, O2 saturation maintaining at 96% on room air. Will continue to monitor.

## 2017-09-16 DIAGNOSIS — R509 Fever, unspecified: Secondary | ICD-10-CM

## 2017-09-16 LAB — CBC
HCT: 40.7 % (ref 39.0–52.0)
Hemoglobin: 13.7 g/dL (ref 13.0–17.0)
MCH: 31.8 pg (ref 26.0–34.0)
MCHC: 33.7 g/dL (ref 30.0–36.0)
MCV: 94.4 fL (ref 78.0–100.0)
PLATELETS: 322 10*3/uL (ref 150–400)
RBC: 4.31 MIL/uL (ref 4.22–5.81)
RDW: 13.4 % (ref 11.5–15.5)
WBC: 9.3 10*3/uL (ref 4.0–10.5)

## 2017-09-16 LAB — URINE CULTURE: CULTURE: NO GROWTH

## 2017-09-16 LAB — BASIC METABOLIC PANEL
Anion gap: 14 (ref 5–15)
BUN: 16 mg/dL (ref 6–20)
CO2: 25 mmol/L (ref 22–32)
CREATININE: 0.82 mg/dL (ref 0.61–1.24)
Calcium: 8.9 mg/dL (ref 8.9–10.3)
Chloride: 99 mmol/L — ABNORMAL LOW (ref 101–111)
Glucose, Bld: 85 mg/dL (ref 65–99)
Potassium: 3.5 mmol/L (ref 3.5–5.1)
SODIUM: 138 mmol/L (ref 135–145)

## 2017-09-16 LAB — LEGIONELLA PNEUMOPHILA SEROGP 1 UR AG: L. PNEUMOPHILA SEROGP 1 UR AG: NEGATIVE

## 2017-09-16 NOTE — Progress Notes (Signed)
Patient ID: Noah Lee, male   DOB: [birth year], 72 y.o.   MRN: [id number]  PROGRESS NOTE    Noah Lee  WUJ:[id number] DOB: [birth year] DOA: 09/08/2017 PCP: Patient, No Pcp Per   Outpatient Specialists: None   Brief Narrative:  Patient admitted with dementia with behavioral abnormalities. He was scheduled to be transferred to Geriatric Dementia unit when he developed fever with Leucocytosis. Suspicion for pneumonia versus viral infection was made. With a white count of 14,000 and mild cough patient was admitted to our service. He was involuntarily committed but not as agitated. Patient is aware of his name and place. He has been combative in the middle of the night and has a Comptrollersitter in place.   Assessment & Plan:   Principal Problem:   Dementia with behavioral disturbance Active Problems:   Seizure disorder (HCC)   BPH (benign prostatic hyperplasia)   HTN (hypertension)   Fever   Febrile illness   Pressure injury of skin   Hypokalemia   Leucocytosis   #1 fever with leukocytosis: These appear to have resolved today. The cause is not entirely clear. Influenza screen was negative. Suspect to some viral illness. Patient was empirically treated with vancomycin and cefepime and currently medically stable. Will discontinue tomorow. He can proceed with placement in geriatric dementia unit. He has decubitus ulcer which may be responsible for the fever and leukocytosis.  #2 dementia with behavioral disturbances: Patient should proceed with placement. I will discontinue involuntary commitment as patient is more stable and not combative this morning. Patient on Abilify with Aricept and Namenda. Also on Zoloft. We will continue these medications  #3 seizure disorder: Patient has not had any seizure this admission. Continue Keppra  #4 decubitus ulcer: Continue wound care.  #5 hypertension: Continue blood pressure control using home regimen   DVT prophylaxis: Lovenox  Code Status:  Full code  Family Communication: None available  Disposition Plan: Awaiting placement to geriatrics dementia unit  Consultants:   None  Procedures: None  Antimicrobials:  Vancomycin and cefepime  Subjective: Patient is calm today. No agitation. No fever and leukocytosis is resolved.  Objective: Vitals:   09/15/17 1422 09/15/17 2239 09/16/17 0451 09/16/17 1501  BP: (!) 95/58 123/74 125/75 (!) 123/58  Pulse: 63 69 72 67  Resp: 17 18 18 16   Temp: 98.3 F (36.8 C) 98.1 F (36.7 C) 98.4 F (36.9 C) 98.7 F (37.1 C)  TempSrc: Oral Oral Oral Axillary  SpO2: 98% 97% 98% 98%  Weight:      Height: 5\' 11"  (1.803 m)       Intake/Output Summary (Last 24 hours) at 09/16/2017 1852 Last data filed at 09/16/2017 1821 Gross per 24 hour  Intake 1360 ml  Output 625 ml  Net 735 ml   Filed Weights   09/08/17 2233  Weight: 113.4 kg (250 lb)    Examination:  General exam: Appears calm and comfortable  Respiratory system: Clear to auscultation. Respiratory effort normal. Cardiovascular system: S1 & S2 heard, RRR. No JVD, murmurs, rubs, gallops or clicks. No pedal edema. Gastrointestinal system: Abdomen is nondistended, soft and nontender. No organomegaly or masses felt. Normal bowel sounds heard. Central nervous system: Alert and oriented. No focal neurological deficits. Extremities: Symmetric 5 x 5 power. Skin: No rashes, lesions or ulcers Psychiatry: Judgement and insight appear normal. Mood & affect appropriate.     Data Reviewed: I have personally reviewed following labs and imaging studies  CBC: Recent Labs  Lab 09/14/17 2332 09/15/17 0847  09/16/17 0256  WBC 14.1* 13.5* 9.3  NEUTROABS 11.3*  --   --   HGB 14.4 13.4 13.7  HCT 42.8 40.5 40.7  MCV 94.3 94.2 94.4  PLT 339 309 322   Basic Metabolic Panel: Recent Labs  Lab 09/14/17 2332 09/15/17 0847 09/16/17 0256  NA 134*  --  138  K 3.1*  --  3.5  CL 98*  --  99*  CO2 22  --  25  GLUCOSE 100*  --  85  BUN 18   --  16  CREATININE 0.83 0.91 0.82  CALCIUM 8.8*  --  8.9   GFR: Estimated Creatinine Clearance: 107.3 mL/min (by C-G formula based on SCr of 0.82 mg/dL). Liver Function Tests: Recent Labs  Lab 09/14/17 2332  AST 24  ALT 11*  ALKPHOS 59  BILITOT 1.4*  PROT 6.4*  ALBUMIN 3.6   No results for input(s): LIPASE, AMYLASE in the last 168 hours. No results for input(s): AMMONIA in the last 168 hours. Coagulation Profile: No results for input(s): INR, PROTIME in the last 168 hours. Cardiac Enzymes: No results for input(s): CKTOTAL, CKMB, CKMBINDEX, TROPONINI in the last 168 hours. BNP (last 3 results) No results for input(s): PROBNP in the last 8760 hours. HbA1C: No results for input(s): HGBA1C in the last 72 hours. CBG: No results for input(s): GLUCAP in the last 168 hours. Lipid Profile: No results for input(s): CHOL, HDL, LDLCALC, TRIG, CHOLHDL, LDLDIRECT in the last 72 hours. Thyroid Function Tests: No results for input(s): TSH, T4TOTAL, FREET4, T3FREE, THYROIDAB in the last 72 hours. Anemia Panel: No results for input(s): VITAMINB12, FOLATE, FERRITIN, TIBC, IRON, RETICCTPCT in the last 72 hours. Urine analysis:    Component Value Date/Time   COLORURINE YELLOW 09/15/2017 0120   APPEARANCEUR CLEAR 09/15/2017 0120   LABSPEC 1.019 09/15/2017 0120   PHURINE 5.0 09/15/2017 0120   GLUCOSEU NEGATIVE 09/15/2017 0120   HGBUR NEGATIVE 09/15/2017 0120   BILIRUBINUR NEGATIVE 09/15/2017 0120   KETONESUR NEGATIVE 09/15/2017 0120   PROTEINUR NEGATIVE 09/15/2017 0120   UROBILINOGEN 0.2 07/16/2012 2320   NITRITE NEGATIVE 09/15/2017 0120   LEUKOCYTESUR NEGATIVE 09/15/2017 0120   Sepsis Labs: @LABRCNTIP (procalcitonin:4,lacticidven:4)  ) Recent Results (from the past 240 hour(s))  Urine culture     Status: None   Collection Time: 09/15/17  1:20 AM  Result Value Ref Range Status   Specimen Description URINE, CATHETERIZED  Final   Special Requests NONE  Final   Culture   Final     NO GROWTH Performed at [facility], [address] [place], [place] [address]    Report Status 09/16/2017 FINAL  Final  Blood culture (routine x 2)     Status: None (Preliminary result)   Collection Time: 09/15/17  2:00 AM  Result Value Ref Range Status   Specimen Description BLOOD RIGHT HAND  Final   Special Requests   Final    BOTTLES DRAWN AEROBIC AND ANAEROBIC Blood Culture adequate volume   Culture   Final    NO GROWTH 1 DAY Performed at [facility], [address] [place], [place] [address]    Report Status PENDING  Incomplete  Blood culture (routine x 2)     Status: None (Preliminary result)   Collection Time: 09/15/17  2:13 AM  Result Value Ref Range Status   Specimen Description BLOOD LEFT ANTECUBITAL  Final   Special Requests   Final    BOTTLES DRAWN AEROBIC AND ANAEROBIC Blood Culture adequate volume  Culture   Final    NO GROWTH 1 DAY Performed at [facility], [address] [place], [place] [address]    Report Status PENDING  Incomplete         Radiology Studies: Dg Chest Port 1 View  Result Date: 09/14/2017 CLINICAL DATA:  72 year old male with cough. EXAM: PORTABLE CHEST 1 VIEW COMPARISON:  Chest radiograph dated 09/09/2017 FINDINGS: The heart size and mediastinal contours are within normal limits. Both lungs are clear. The visualized skeletal structures are unremarkable. IMPRESSION: No active disease. Electronically Signed   By: Elgie Collard M.D.   On: 09/14/2017 22:37        Scheduled Meds: . amLODipine  5 mg Oral Daily  . ARIPiprazole  10 mg Oral QHS  . atorvastatin  20 mg Oral Daily  . clopidogrel  75 mg Oral Daily  . docusate sodium  100 mg Oral BID  . donepezil  10 mg Oral Daily  . enoxaparin (LOVENOX) injection  55 mg Subcutaneous Q24H  . finasteride  5 mg Oral Daily  . hydrochlorothiazide  25 mg Oral Daily  . levETIRAcetam  500 mg Oral BID  . Melatonin  6 mg Oral QHS  . memantine  10 mg Oral BID  . pantoprazole   40 mg Oral QAC breakfast  . pramipexole  0.25 mg Oral QHS  . sertraline  75 mg Oral Daily  . cyanocobalamin  1,000 mcg Oral Daily   Continuous Infusions: . ceFEPime (MAXIPIME) IV Stopped (09/16/17 1518)  . vancomycin Stopped (09/16/17 1129)     LOS: 1 day    Time spent: 85    Aleynah Rocchio,LAWAL, MD [facility] Pager [phone]  If 7PM-7AM, please contact night-coverage [email or web address] Password [facility] 09/16/2017, 6:52 PM

## 2017-09-16 NOTE — Plan of Care (Signed)
Progressing

## 2017-09-17 MED ORDER — ENOXAPARIN SODIUM 40 MG/0.4ML ~~LOC~~ SOLN
40.0000 mg | SUBCUTANEOUS | Status: DC
  Administered 2017-09-18 – 2017-09-19 (×2): 40 mg via SUBCUTANEOUS
  Filled 2017-09-17 (×2): qty 0.4

## 2017-09-17 NOTE — Progress Notes (Signed)
Patient ID: Noah Lee, male   DOB: 03/21/1946, 72 y.o.   MRN: [id number]  PROGRESS NOTE    Noah Lee  YNW:[id number] DOB: 03/21/1946 DOA: 09/08/2017 PCP: Patient, No Pcp Per   Outpatient Specialists: None   Brief Narrative:  Patient admitted with dementia with behavioral abnormalities. He was scheduled to be transferred to Geriatric Dementia unit when he developed fever with Leucocytosis. Suspicion for pneumonia versus viral infection was made. With a white count of 14,000 and mild cough patient was admitted to our service. He was involuntarily committed but not as agitated. Patient is aware of his name and place. He has been stable and his sitter has been discontinued and he is not longer involuntarily committed.  Assessment & Plan:   Principal Problem:   Dementia with behavioral disturbance Active Problems:   Seizure disorder (HCC)   BPH (benign prostatic hyperplasia)   HTN (hypertension)   Fever   Febrile illness   Pressure injury of skin   Hypokalemia   Leucocytosis   #1 fever with leukocytosis: This appears resolved. Patient still on antibiotics and I will discontinue today since cultures are negative. This may have been related to infected decubitus also acute viral illness. From medical point of view he is cleared to be transferred to geriatric dementia unit.  #2 dementia with behavioral disturbances: Patient should proceed with placement. Continue Abilify with Aricept and Namenda. Also on Zoloft. We will continue these medications  #3 seizure disorder: Patient has not had any seizure this admission. Continue Keppra  #4 decubitus ulcer: Continue wound care.  #5 hypertension: Continue blood pressure control using home regimen   DVT prophylaxis: Lovenox  Code Status: Full code  Family Communication: None available  Disposition Plan: Awaiting placement to geriatrics dementia unit  Consultants:   None  Procedures: None  Antimicrobials:  Vancomycin  and cefepime  Subjective: Patient is calm today. No agitation. No fever and leukocytosis is resolved.  Objective: Vitals:   09/15/17 2239 09/16/17 0451 09/16/17 1501 09/17/17 0516  BP: 123/74 125/75 (!) 123/58 116/64  Pulse: 69 72 67 (!) 58  Resp: 18 18 16 17   Temp: 98.1 F (36.7 C) 98.4 F (36.9 C) 98.7 F (37.1 C) (!) 97.5 F (36.4 C)  TempSrc: Oral Oral Axillary Oral  SpO2: 97% 98% 98% 100%  Weight:    92.7 kg (204 lb 5.9 oz)  Height:        Intake/Output Summary (Last 24 hours) at 09/17/2017 0835 Last data filed at 09/17/2017 0807 Gross per 24 hour  Intake 950 ml  Output 950 ml  Net 0 ml   Filed Weights   09/08/17 2233 09/17/17 0516  Weight: 113.4 kg (250 lb) 92.7 kg (204 lb 5.9 oz)    Examination:  General exam: Appears calm and comfortable  Respiratory system: Clear to auscultation. Respiratory effort normal. Cardiovascular system: S1 & S2 heard, RRR. No JVD, murmurs, rubs, gallops or clicks. No pedal edema. Gastrointestinal system: Abdomen is nondistended, soft and nontender. No organomegaly or masses felt. Normal bowel sounds heard. Central nervous system: Alert and oriented. No focal neurological deficits. Extremities: Symmetric 5 x 5 power. Skin: No rashes, lesions or ulcers Psychiatry: Judgement and insight appear normal. Mood & affect appropriate.     Data Reviewed: I have personally reviewed following labs and imaging studies  CBC: Recent Labs  Lab 09/14/17 2332 09/15/17 0847 09/16/17 0256  WBC 14.1* 13.5* 9.3  NEUTROABS 11.3*  --   --   HGB 14.4 13.4  13.7  HCT 42.8 40.5 40.7  MCV 94.3 94.2 94.4  PLT 339 309 322   Basic Metabolic Panel: Recent Labs  Lab 09/14/17 2332 09/15/17 0847 09/16/17 0256  NA 134*  --  138  K 3.1*  --  3.5  CL 98*  --  99*  CO2 22  --  25  GLUCOSE 100*  --  85  BUN 18  --  16  CREATININE 0.83 0.91 0.82  CALCIUM 8.8*  --  8.9   GFR: Estimated Creatinine Clearance: 97.6 mL/min (by C-G formula based on SCr of  0.82 mg/dL). Liver Function Tests: Recent Labs  Lab 09/14/17 2332  AST 24  ALT 11*  ALKPHOS 59  BILITOT 1.4*  PROT 6.4*  ALBUMIN 3.6   No results for input(s): LIPASE, AMYLASE in the last 168 hours. No results for input(s): AMMONIA in the last 168 hours. Coagulation Profile: No results for input(s): INR, PROTIME in the last 168 hours. Cardiac Enzymes: No results for input(s): CKTOTAL, CKMB, CKMBINDEX, TROPONINI in the last 168 hours. BNP (last 3 results) No results for input(s): PROBNP in the last 8760 hours. HbA1C: No results for input(s): HGBA1C in the last 72 hours. CBG: No results for input(s): GLUCAP in the last 168 hours. Lipid Profile: No results for input(s): CHOL, HDL, LDLCALC, TRIG, CHOLHDL, LDLDIRECT in the last 72 hours. Thyroid Function Tests: No results for input(s): TSH, T4TOTAL, FREET4, T3FREE, THYROIDAB in the last 72 hours. Anemia Panel: No results for input(s): VITAMINB12, FOLATE, FERRITIN, TIBC, IRON, RETICCTPCT in the last 72 hours. Urine analysis:    Component Value Date/Time   COLORURINE YELLOW 09/15/2017 0120   APPEARANCEUR CLEAR 09/15/2017 0120   LABSPEC 1.019 09/15/2017 0120   PHURINE 5.0 09/15/2017 0120   GLUCOSEU NEGATIVE 09/15/2017 0120   HGBUR NEGATIVE 09/15/2017 0120   BILIRUBINUR NEGATIVE 09/15/2017 0120   KETONESUR NEGATIVE 09/15/2017 0120   PROTEINUR NEGATIVE 09/15/2017 0120   UROBILINOGEN 0.2 07/16/2012 2320   NITRITE NEGATIVE 09/15/2017 0120   LEUKOCYTESUR NEGATIVE 09/15/2017 0120   Sepsis Labs: @LABRCNTIP (procalcitonin:4,lacticidven:4)  ) Recent Results (from the past 240 hour(s))  Urine culture     Status: None   Collection Time: 09/15/17  1:20 AM  Result Value Ref Range Status   Specimen Description URINE, CATHETERIZED  Final   Special Requests NONE  Final   Culture   Final    NO GROWTH Performed at [facility], [address], [place], [place] [address]    Report Status 09/16/2017 FINAL  Final  Blood culture  (routine x 2)     Status: None (Preliminary result)   Collection Time: 09/15/17  2:00 AM  Result Value Ref Range Status   Specimen Description BLOOD RIGHT HAND  Final   Special Requests   Final    BOTTLES DRAWN AEROBIC AND ANAEROBIC Blood Culture adequate volume   Culture   Final    NO GROWTH 1 DAY Performed at [facility], [address], [place], [place] [address]    Report Status PENDING  Incomplete  Blood culture (routine x 2)     Status: None (Preliminary result)   Collection Time: 09/15/17  2:13 AM  Result Value Ref Range Status   Specimen Description BLOOD LEFT ANTECUBITAL  Final   Special Requests   Final    BOTTLES DRAWN AEROBIC AND ANAEROBIC Blood Culture adequate volume   Culture   Final    NO GROWTH 1 DAY Performed at [facility], [address] [place], [place] [address]    Report Status PENDING  Incomplete         Radiology Studies: No results found.      Scheduled Meds: . amLODipine  5 mg Oral Daily  . ARIPiprazole  10 mg Oral QHS  . atorvastatin  20 mg Oral Daily  . clopidogrel  75 mg Oral Daily  . docusate sodium  100 mg Oral BID  . donepezil  10 mg Oral Daily  . enoxaparin (LOVENOX) injection  55 mg Subcutaneous Q24H  . finasteride  5 mg Oral Daily  . hydrochlorothiazide  25 mg Oral Daily  . levETIRAcetam  500 mg Oral BID  . Melatonin  6 mg Oral QHS  . memantine  10 mg Oral BID  . pantoprazole  40 mg Oral QAC breakfast  . pramipexole  0.25 mg Oral QHS  . sertraline  75 mg Oral Daily  . cyanocobalamin  1,000 mcg Oral Daily   Continuous Infusions: . ceFEPime (MAXIPIME) IV Stopped (09/17/17 0729)  . vancomycin Stopped (09/17/17 0130)     LOS: 2 days    Time spent: 30    GARBA,LAWAL, MD [facility] Pager [phone]  If 7PM-7AM, please contact night-coverage [email or web address] Password [facility] 09/17/2017, 8:35 AM

## 2017-09-18 NOTE — [place] FL2 (Signed)
[place] MEDICAID FL2 LEVEL OF CARE SCREENING TOOL     IDENTIFICATION  Patient Name: Noah Lee Birthdate: [birth year] Sex: male Admission Date (Current Location): 09/08/2017  [facility] [place]   Facility and Address:  [facility], [address], [place], [place] [address]      Provider Number: [id number]  Attending Physician Name and Address:  Rometta EmeryGarba, Mohammad L, MD  Relative Name and Phone Number:  Bronson CurbKathyrn, daughter, [phone]    Current Level of Care: Hospital Recommended Level of Care: Skilled Nursing Facility Prior Approval Number:    Date Approved/Denied:   PASRR Number: [id number]  Discharge Plan: SNF    Current Diagnoses: Patient Active Problem List   Diagnosis Date Noted  . Fever 09/15/2017  . Febrile illness 09/15/2017  . Pressure injury of skin 09/15/2017  . Hypokalemia 09/15/2017  . Leucocytosis 09/15/2017  . Dementia with behavioral disturbance 09/13/2017  . Suicidal ideation   . TIA (transient ischemic attack) 07/17/2012  . Seizure disorder (HCC) 07/17/2012  . BPH (benign prostatic hyperplasia) 07/17/2012  . HTN (hypertension) 07/17/2012    Orientation RESPIRATION BLADDER Height & Weight     Self, Place  Normal Incontinent, External catheter Weight: 92.7 kg (204 lb 5.9 oz) Height:  5\' 11"  (180.3 cm)  BEHAVIORAL SYMPTOMS/MOOD NEUROLOGICAL BOWEL NUTRITION STATUS  Other (Comment)(Has had behavioral disturbances at home but none while in the hospital)   Incontinent Diet(Please see DC Summary)  AMBULATORY STATUS COMMUNICATION OF NEEDS Skin   Extensive Assist Verbally PU Stage and Appropriate Care(Deep tissue injury on coccyx;)                       Personal Care Assistance Level of Assistance  Bathing, Feeding, Dressing Bathing Assistance: Maximum assistance Feeding assistance: Limited assistance Dressing Assistance: Limited assistance     Functional Limitations Info              SPECIAL CARE FACTORS FREQUENCY  PT (By licensed PT)     PT Frequency: 5x/week              Contractures      Additional Factors Info  Code Status, Allergies Code Status Info: Full Allergies Info: Ambien Zolpidem Tartrate           Current Medications (09/18/2017):  This is the current hospital active medication list Current Facility-Administered Medications  Medication Dose Route Frequency Provider Last Rate Last Dose  . acetaminophen (TYLENOL) tablet 650 mg  650 mg Oral Q6H PRN Eduard ClosKakrakandy, Arshad N, MD       Or  . acetaminophen (TYLENOL) suppository 650 mg  650 mg Rectal Q6H PRN Eduard ClosKakrakandy, Arshad N, MD      . amLODipine (NORVASC) tablet 5 mg  5 mg Oral Daily Linwood DibblesKnapp, Jon, MD   5 mg at 09/18/17 1003  . ARIPiprazole (ABILIFY) tablet 10 mg  10 mg Oral QHS Linwood DibblesKnapp, Jon, MD   10 mg at 09/17/17 2128  . atorvastatin (LIPITOR) tablet 20 mg  20 mg Oral Daily Linwood DibblesKnapp, Jon, MD   20 mg at 09/18/17 1002  . baclofen (LIORESAL) tablet 15 mg  15 mg Oral TID PRN Linwood DibblesKnapp, Jon, MD   15 mg at 09/12/17 2037  . clopidogrel (PLAVIX) tablet 75 mg  75 mg Oral Daily Linwood DibblesKnapp, Jon, MD   75 mg at 09/18/17 1003  . docusate sodium (COLACE) capsule 100 mg  100 mg Oral BID Linwood DibblesKnapp, Jon, MD   100 mg at  09/18/17 1004  . donepezil (ARICEPT) tablet 10 mg  10 mg Oral Daily Linwood Dibbles, MD   10 mg at 09/18/17 1003  . enoxaparin (LOVENOX) injection 40 mg  40 mg Subcutaneous Q24H Hammons, Kimberly B, RPH   40 mg at 09/18/17 1003  . finasteride (PROSCAR) tablet 5 mg  5 mg Oral Daily Linwood Dibbles, MD   5 mg at 09/18/17 1003  . hydrochlorothiazide (HYDRODIURIL) tablet 25 mg  25 mg Oral Daily Linwood Dibbles, MD   25 mg at 09/18/17 1012  . levETIRAcetam (KEPPRA) tablet 500 mg  500 mg Oral BID Linwood Dibbles, MD   500 mg at 09/18/17 1002  . Melatonin TABS 6 mg  6 mg Oral QHS Linwood Dibbles, MD   6 mg at 09/17/17 2127  . memantine (NAMENDA) tablet 10 mg  10 mg Oral BID Linwood Dibbles, MD   10 mg at 09/18/17 1003  . ondansetron (ZOFRAN) tablet 4 mg   4 mg Oral Q6H PRN Eduard Clos, MD       Or  . ondansetron [facility]) injection 4 mg  4 mg Intravenous Q6H PRN Eduard Clos, MD      . pantoprazole (PROTONIX) EC tablet 40 mg  40 mg Oral QAC breakfast Linwood Dibbles, MD   40 mg at 09/18/17 1002  . polyvinyl alcohol (LIQUIFILM TEARS) 1.4 % ophthalmic solution 1 drop  1 drop Both Eyes QID PRN Linwood Dibbles, MD      . pramipexole (MIRAPEX) tablet 0.25 mg  0.25 mg Oral QHS Linwood Dibbles, MD   0.25 mg at 09/17/17 2128  . sertraline (ZOLOFT) tablet 75 mg  75 mg Oral Daily Linwood Dibbles, MD   75 mg at 09/18/17 1002  . vitamin B-12 (CYANOCOBALAMIN) tablet 1,000 mcg  1,000 mcg Oral Daily Linwood Dibbles, MD   1,000 mcg at 09/18/17 1003     Discharge Medications: Please see discharge summary for a list of discharge medications.  Relevant Imaging Results:  Relevant Lab Results:   Additional Information SSN: [id number] [place], [place]

## 2017-09-18 NOTE — Progress Notes (Signed)
Patient ID: Noah Lee, male   DOB: 1945-12-16, 72 y.o.   MRN: [id number]  PROGRESS NOTE    Noah Lee  WUJ:[id number] DOB: [birth year] DOA: 09/08/2017 PCP: Patient, No Pcp Per   Outpatient Specialists: None   Brief Narrative:  Patient admitted with dementia with behavioral abnormalities. He was scheduled to be transferred to Geriatric Dementia unit when he developed fever with Leucocytosis. Suspicion for pneumonia versus viral infection was made. With a white count of 14,000 and mild cough patient was admitted to our service. He was involuntarily committed but not as agitated. Patient is aware of his name and place. He has been stable and his sitter has been discontinued and he is not longer involuntarily committed.  Assessment & Plan:   Principal Problem:   Dementia with behavioral disturbance Active Problems:   Seizure disorder (HCC)   BPH (benign prostatic hyperplasia)   HTN (hypertension)   Fever   Febrile illness   Pressure injury of skin   Hypokalemia   Leucocytosis   #1 fever with leukocytosis: No fever for 3 days now. Medically stable.  This may have been related to infected decubitus also acute viral illness. From medical point of view he is cleared to be transferred to geriatric dementia unit.  #2 dementia with behavioral disturbances: Patient should proceed with placement. Continue Abilify with Aricept and Namenda. Also on Zoloft. We will continue these medications. He is less combative and very interactive.  #3 seizure disorder: Patient has not had any seizure this admission. Continue Keppra  #4 decubitus ulcer: Continue wound care.  #5 hypertension: Continue blood pressure control using home regimen   DVT prophylaxis: Lovenox  Code Status: Full code  Family Communication: None available  Disposition Plan: Awaiting placement to geriatrics dementia unit  Consultants:   None  Procedures: None  Antimicrobials:  Vancomycin and  cefepime  Subjective: Patient is calm today. No agitation. No fever and leukocytosis is resolved. He is carrying out conversation.  Objective: Vitals:   09/17/17 2138 09/18/17 0522 09/18/17 1002 09/18/17 1415  BP: 140/76 135/70 128/72 124/69  Pulse: 64 61  64  Resp: 17 15  16   Temp: 98.1 F (36.7 C) 98.2 F (36.8 C)  98.4 F (36.9 C)  TempSrc: Axillary Oral  Oral  SpO2: 97% 97%  97%  Weight:      Height:        Intake/Output Summary (Last 24 hours) at 09/18/2017 1718 Last data filed at 09/18/2017 1413 Gross per 24 hour  Intake 222 ml  Output 800 ml  Net -578 ml   Filed Weights   09/08/17 2233 09/17/17 0516  Weight: 113.4 kg (250 lb) 92.7 kg (204 lb 5.9 oz)    Examination:  General exam: Appears calm and comfortable  Respiratory system: Clear to auscultation. Respiratory effort normal. Cardiovascular system: S1 & S2 heard, RRR. No JVD, murmurs, rubs, gallops or clicks. No pedal edema. Gastrointestinal system: Abdomen is nondistended, soft and nontender. No organomegaly or masses felt. Normal bowel sounds heard. Central nervous system: Alert and oriented. No focal neurological deficits. Extremities: Symmetric 5 x 5 power. Skin: No rashes, lesions or ulcers Psychiatry: Judgement and insight appear normal. Mood & affect appropriate.     Data Reviewed: I have personally reviewed following labs and imaging studies  CBC: Recent Labs  Lab 09/14/17 2332 09/15/17 0847 09/16/17 0256  WBC 14.1* 13.5* 9.3  NEUTROABS 11.3*  --   --   HGB 14.4 13.4 13.7  HCT 42.8 40.5 40.7  MCV 94.3 94.2 94.4  PLT 339 309 322   Basic Metabolic Panel: Recent Labs  Lab 09/14/17 2332 09/15/17 0847 09/16/17 0256  NA 134*  --  138  K 3.1*  --  3.5  CL 98*  --  99*  CO2 22  --  25  GLUCOSE 100*  --  85  BUN 18  --  16  CREATININE 0.83 0.91 0.82  CALCIUM 8.8*  --  8.9   GFR: Estimated Creatinine Clearance: 97.6 mL/min (by C-G formula based on SCr of 0.82 mg/dL). Liver Function  Tests: Recent Labs  Lab 09/14/17 2332  AST 24  ALT 11*  ALKPHOS 59  BILITOT 1.4*  PROT 6.4*  ALBUMIN 3.6   No results for input(s): LIPASE, AMYLASE in the last 168 hours. No results for input(s): AMMONIA in the last 168 hours. Coagulation Profile: No results for input(s): INR, PROTIME in the last 168 hours. Cardiac Enzymes: No results for input(s): CKTOTAL, CKMB, CKMBINDEX, TROPONINI in the last 168 hours. BNP (last 3 results) No results for input(s): PROBNP in the last 8760 hours. HbA1C: No results for input(s): HGBA1C in the last 72 hours. CBG: No results for input(s): GLUCAP in the last 168 hours. Lipid Profile: No results for input(s): CHOL, HDL, LDLCALC, TRIG, CHOLHDL, LDLDIRECT in the last 72 hours. Thyroid Function Tests: No results for input(s): TSH, T4TOTAL, FREET4, T3FREE, THYROIDAB in the last 72 hours. Anemia Panel: No results for input(s): VITAMINB12, FOLATE, FERRITIN, TIBC, IRON, RETICCTPCT in the last 72 hours. Urine analysis:    Component Value Date/Time   COLORURINE YELLOW 09/15/2017 0120   APPEARANCEUR CLEAR 09/15/2017 0120   LABSPEC 1.019 09/15/2017 0120   PHURINE 5.0 09/15/2017 0120   GLUCOSEU NEGATIVE 09/15/2017 0120   HGBUR NEGATIVE 09/15/2017 0120   BILIRUBINUR NEGATIVE 09/15/2017 0120   KETONESUR NEGATIVE 09/15/2017 0120   PROTEINUR NEGATIVE 09/15/2017 0120   UROBILINOGEN 0.2 07/16/2012 2320   NITRITE NEGATIVE 09/15/2017 0120   LEUKOCYTESUR NEGATIVE 09/15/2017 0120   Sepsis Labs: @LABRCNTIP (procalcitonin:4,lacticidven:4)  ) Recent Results (from the past 240 hour(s))  Urine culture     Status: None   Collection Time: 09/15/17  1:20 AM  Result Value Ref Range Status   Specimen Description URINE, CATHETERIZED  Final   Special Requests NONE  Final   Culture   Final    NO GROWTH Performed at [facility], [address] [place], [place] [address]    Report Status 09/16/2017 FINAL  Final  Blood culture (routine x 2)     Status: None  (Preliminary result)   Collection Time: 09/15/17  2:00 AM  Result Value Ref Range Status   Specimen Description BLOOD RIGHT HAND  Final   Special Requests   Final    BOTTLES DRAWN AEROBIC AND ANAEROBIC Blood Culture adequate volume   Culture   Final    NO GROWTH 3 DAYS Performed at [facility], [address] [place], [place] [address]    Report Status PENDING  Incomplete  Blood culture (routine x 2)     Status: None (Preliminary result)   Collection Time: 09/15/17  2:13 AM  Result Value Ref Range Status   Specimen Description BLOOD LEFT ANTECUBITAL  Final   Special Requests   Final    BOTTLES DRAWN AEROBIC AND ANAEROBIC Blood Culture adequate volume   Culture   Final    NO GROWTH 3 DAYS Performed at [facility], [address] [place], [place] [address]  Report Status PENDING  Incomplete         Radiology Studies: No results found.      Scheduled Meds: . amLODipine  5 mg Oral Daily  . ARIPiprazole  10 mg Oral QHS  . atorvastatin  20 mg Oral Daily  . clopidogrel  75 mg Oral Daily  . docusate sodium  100 mg Oral BID  . donepezil  10 mg Oral Daily  . enoxaparin (LOVENOX) injection  40 mg Subcutaneous Q24H  . finasteride  5 mg Oral Daily  . hydrochlorothiazide  25 mg Oral Daily  . levETIRAcetam  500 mg Oral BID  . Melatonin  6 mg Oral QHS  . memantine  10 mg Oral BID  . pantoprazole  40 mg Oral QAC breakfast  . pramipexole  0.25 mg Oral QHS  . sertraline  75 mg Oral Daily  . cyanocobalamin  1,000 mcg Oral Daily   Continuous Infusions:    LOS: 3 days    Time spent: 69    Klarissa Mcilvain,LAWAL, MD [facility] Pager [phone]  If 7PM-7AM, please contact night-coverage [email or web address] Password [email or web address] 09/18/2017, 5:18 PM

## 2017-09-18 NOTE — Evaluation (Signed)
Physical Therapy Evaluation Patient Details Name: Noah Lee MRN: [id number] DOB: 05-Dec-1945 Today's Date: 09/18/2017   History of Present Illness  Patient admitted with dementia with behavioral abnormalities. He was scheduled to be transferred to Geriatric Dementia unit when he developed fever with Leukocytosis.  Clinical Impression  Pt admitted with above diagnosis. Pt currently with functional limitations due to the deficits listed below (see PT Problem List). Pt following commands today but with poor self awareness and awareness of his environment. Min A for mobility but pt with uncontrolled and unbeknownst to him bowel movement while standing and ambulating. Needed min A to guide RW during gait due to poor attention to obstacles. Mod A needed for return to bed due to general LE weakness. Pt dizzy with initial standing, improved with time up.  Pt will benefit from skilled PT to increase their independence and safety with mobility to allow discharge to the venue listed below.       Follow Up Recommendations SNF;Supervision/Assistance - 24 hour    Equipment Recommendations  Rolling walker with 5" wheels    Recommendations for Other Services       Precautions / Restrictions Precautions Precautions: Fall Restrictions Weight Bearing Restrictions: No      Mobility  Bed Mobility Overal bed mobility: Needs Assistance Bed Mobility: Supine to Sit;Sit to Supine     Supine to sit: Min assist Sit to supine: Mod assist   General bed mobility comments: min A for elevation of trunk into sitting, pt was able to get LE's off bed and then scoot self to edge with increased time. With return to supine, needed mod A to lift LE's against gravity into bed  Transfers Overall transfer level: Needs assistance Equipment used: Rolling walker (2 wheeled) Transfers: Sit to/from Stand Sit to Stand: Min assist         General transfer comment: min A for power up for first stand but min A only  to steady with subsequent standing. vc's for hand placement each time.   Ambulation/Gait Ambulation/Gait assistance: Min assist Ambulation Distance (Feet): 20 Feet Assistive device: Rolling walker (2 wheeled) Gait Pattern/deviations: Step-through pattern;Decreased stride length;Trunk flexed Gait velocity: decreased Gait velocity interpretation: <1.8 ft/sec, indicative of risk for recurrent falls General Gait Details: pt with dizziness with initial standing but subsided enough to ambulate after 2 more stands and about 5 mins. Pt ambulated to bathroom to sit on toilet because he was having continuous uncontrolled BM. Needed min A to navigate RW around obstacles and through doorways.   Stairs            Wheelchair Mobility    Modified Rankin (Stroke Patients Only)       Balance Overall balance assessment: Needs assistance Sitting-balance support: No upper extremity supported Sitting balance-Leahy Scale: Fair     Standing balance support: Bilateral upper extremity supported Standing balance-Leahy Scale: Poor Standing balance comment: reliant on UE support                             Pertinent Vitals/Pain Pain Assessment: No/denies pain    Home Living Family/patient expects to be discharged to:: Private residence Living Arrangements: Children Available Help at Discharge: Family;Available PRN/intermittently Type of Home: Apartment Home Access: Stairs to enter Entrance Stairs-Rails: Right Entrance Stairs-Number of Steps: 4 Home Layout: One level Home Equipment: Cane - single point Additional Comments: home info was from previous admission, pt is poor historian and cannot give me accurate  update so unsure is home environment is still as above. He reports that he lives with his daughter and she works.     Prior Function Level of Independence: Needs assistance   Gait / Transfers Assistance Needed: he reports that he did not need AD, though again poor  historian  ADL's / Homemaking Assistance Needed: unsure  Comments: dementia was pre existing he was at least needing assist with home management since admission in 2014     Hand Dominance        Extremity/Trunk Assessment   Upper Extremity Assessment Upper Extremity Assessment: Generalized weakness    Lower Extremity Assessment Lower Extremity Assessment: Generalized weakness    Cervical / Trunk Assessment Cervical / Trunk Assessment: Kyphotic  Communication   Communication: No difficulties  Cognition Arousal/Alertness: Awake/alert Behavior During Therapy: Flat affect Overall Cognitive Status: History of cognitive impairments - at baseline                                 General Comments: follows commands, decreased awareness of self and environment around him. Was having BM and did not know it.       General Comments      Exercises     Assessment/Plan    PT Assessment Patient needs continued PT services  PT Problem List Decreased strength;Decreased activity tolerance;Decreased balance;Decreased mobility;Decreased knowledge of use of DME;Decreased knowledge of precautions;Decreased safety awareness       PT Treatment Interventions DME instruction;Gait training;Functional mobility training;Therapeutic activities;Therapeutic exercise;Balance training;Cognitive remediation;Patient/family education;Neuromuscular re-education    PT Goals (Current goals can be found in the Care Plan section)  Acute Rehab PT Goals Patient Stated Goal: none stated PT Goal Formulation: Patient unable to participate in goal setting Time For Goal Achievement: 10/02/17 Potential to Achieve Goals: Fair    Frequency Min 2X/week   Barriers to discharge Decreased caregiver support does not appear to have 24 hr assist.     Co-evaluation               AM-PAC PT "6 Clicks" Daily Activity  Outcome Measure Difficulty turning over in bed (including adjusting bedclothes,  sheets and blankets)?: A Little Difficulty moving from lying on back to sitting on the side of the bed? : Unable Difficulty sitting down on and standing up from a chair with arms (e.g., wheelchair, bedside commode, etc,.)?: Unable Help needed moving to and from a bed to chair (including a wheelchair)?: A Little Help needed walking in hospital room?: A Little Help needed climbing 3-5 steps with a railing? : A Lot 6 Click Score: 13    End of Session Equipment Utilized During Treatment: Gait belt Activity Tolerance: Patient tolerated treatment well Patient left: in bed;with call bell/phone within reach;with nursing/sitter in room Nurse Communication: Mobility status PT Visit Diagnosis: Unsteadiness on feet (R26.81);Muscle weakness (generalized) (M62.81);Difficulty in walking, not elsewhere classified (R26.2);Dizziness and giddiness (R42)    Time: 1320-1400 PT Time Calculation (min) (ACUTE ONLY): 40 min   Charges:   PT Evaluation $PT Eval Moderate Complexity: 1 Mod PT Treatments $Gait Training: 8-22 mins $Therapeutic Activity: 8-22 mins   PT G Codes:        Lyanne CoVictoria Tahjai Schetter, PT  Acute Rehab Services  [phone]   NehawkaVictoria L Elain Wixon 09/18/2017, 2:18 PM

## 2017-09-18 NOTE — Plan of Care (Signed)
  Progressing Activity: Risk for activity intolerance will decrease 09/18/2017 0107 - Progressing by Burtis Junesrewery, Nolah Krenzer, RN 09/18/2017 0107 - Progressing by Burtis Junesrewery, Takeira Yanes, RN Elimination: Will not experience complications related to bowel motility 09/18/2017 0107 - Progressing by Burtis Junesrewery, Marymargaret Kirker, RN 09/18/2017 0107 - Progressing by Burtis Junesrewery, Django Nguyen, RN

## 2017-09-19 NOTE — Progress Notes (Addendum)
CSW consulted for PTAR. Patient's daughter would like to pick up patient and will be here within the hour.   CSW signing off.  Osborne Cascoadia Laira Penninger LCSW [phone]

## 2017-09-19 NOTE — Progress Notes (Addendum)
Patient's daughter in agreement with plan to take patient home today. She requests PTAR after lunch. Someone will be at the house to accept him. She would like home health and is planning on reaching out to the [facility] for a sitter.   CSW signing off.  CSW completed SNF search at request of patient's daughter but no SNFs available. Per medical director, patient is custodial and we are unable to place him. CSW left daughter a vm to let her know patient will need to return home.   Osborne Cascoadia Francys Bolin LCSW [phone]

## 2017-09-19 NOTE — Progress Notes (Signed)
Waiting on patient's daughter to pick him up.

## 2017-09-19 NOTE — Discharge Summary (Signed)
Physician Discharge Summary  Raynelle FanningDennis W Hartwig ZOX:[id number] DOB: May 24, 1946 DOA: 09/08/2017  PCP: Patient, No Pcp Per  Admit date: 09/08/2017 Discharge date: 09/19/2017  Time spent: 36  minutes  Recommendations for Outpatient Follow-up:  1. Resume all home medications  2. Follow-up with primary care and psychiatry   Discharge Diagnoses:  Principal Problem:   Dementia with behavioral disturbance Active Problems:   Seizure disorder (HCC)   BPH (benign prostatic hyperplasia)   HTN (hypertension)   Fever   Febrile illness   Pressure injury of skin   Hypokalemia   Leucocytosis   Discharge Condition: Fair  Diet recommendation: Regular  Filed Weights   09/08/17 2233 09/17/17 0516  Weight: 113.4 kg (250 lb) 92.7 kg (204 lb 5.9 oz)    History of present illness:  Patient admitted with dementia with behavioral abnormalities. He was scheduled to be transferred to Geriatric Dementia unit when he developed fever with Leucocytosis. Suspicion for pneumonia versus viral infection was made. With a white count of 14,000 and mild cough patient was admitted to our service. He was involuntarily committed but not as agitated.     Hospital Course:  Patient had cultures obtained for both blood urine but all cultures were negative. He was initially on vancomycin and cefepime for the first 3 days. These were discontinued. It appeared he may have had some viral illness. His behavior was initially the focus. He had a sitter in place and was very agitated. Patient was restrained in the beginning but continued to do better on home regimen. For the last 48 hours he has been calm and collected. He is alert oriented in person and place. Discussion with the family was held and they recommended taking him home with home health rather than placement in Dementia unit. Patient also has history of seizure disorder and was maintain on home regimen. He has hypertension and other chronic medical problems all of which  were stable during hospitalization.  Procedures: None  Consultations:  None  Discharge Exam: Vitals:   09/19/17 0500 09/19/17 0933  BP: (!) 103/54 116/68  Pulse: (!) 52 (!) 53  Resp: 16   Temp: 97.9 F (36.6 C)   SpO2: 94%     General: Stable no acute distress Cardiovascular: Regular rate and rhythm Respiratory: Good air entry bilaterally, no wheezes no wrestling or crackles  Discharge Instructions   Discharge Instructions    Diet - low sodium heart healthy   Complete by:  As directed    Increase activity slowly   Complete by:  As directed      Allergies as of 09/19/2017      Reactions   Ambien [zolpidem Tartrate] Other (See Comments)   Sleep walking      Medication List    STOP taking these medications   aspirin 81 MG EC tablet     TAKE these medications   amLODipine 5 MG tablet Commonly known as:  NORVASC Take 5 mg by mouth daily.   ARIPiprazole 10 MG tablet Commonly known as:  ABILIFY Take 10 mg by mouth at bedtime.   atorvastatin 20 MG tablet Commonly known as:  LIPITOR Take 20 mg by mouth daily.   baclofen 10 MG tablet Commonly known as:  LIORESAL Take 15 mg by mouth 3 (three) times daily as needed for muscle spasms (restless legs).   Carboxymethylcellulose Sodium 0.25 % Soln Place 1 drop into both eyes 4 (four) times daily as needed (dry eyes).   clopidogrel 75 MG tablet Commonly known  as:  PLAVIX Take 75 mg by mouth daily.   cyanocobalamin 500 MCG tablet Take 1,000 mcg by mouth daily.   docusate sodium 100 MG capsule Commonly known as:  COLACE Take 100 mg by mouth 2 (two) times daily.   donepezil 10 MG tablet Commonly known as:  ARICEPT Take 10 mg by mouth daily.   finasteride 5 MG tablet Commonly known as:  PROSCAR Take 5 mg by mouth daily.   hydrochlorothiazide 25 MG tablet Commonly known as:  HYDRODIURIL Take 25 mg by mouth daily.   levETIRAcetam 500 MG tablet Commonly known as:  KEPPRA Take 500 mg by mouth 2 (two)  times daily. For seizures   Melatonin 3 MG Tabs Take 6 mg by mouth at bedtime.   memantine 10 MG tablet Commonly known as:  NAMENDA Take 10 mg by mouth 2 (two) times daily.   MENTHOL-METHYL SALICYLATE EX Apply 1 application topically 3 (three) times daily as needed (knee and joint pain and inflammation).   pantoprazole 40 MG tablet Commonly known as:  PROTONIX Take 40 mg by mouth daily before breakfast.   pramipexole 0.25 MG tablet Commonly known as:  MIRAPEX Take 0.25 mg by mouth at bedtime.   sertraline 50 MG tablet Commonly known as:  ZOLOFT Take 75 mg by mouth daily.      Allergies  Allergen Reactions  . Ambien [Zolpidem Tartrate] Other (See Comments)    Sleep walking   Follow-up Information    Home, [facility].   Specialty:  Home Health Services Why:  They will do your home health care at your home Contact information: [address] [place] [address] [place] [address] [phone]            The results of significant diagnostics from this hospitalization (including imaging, microbiology, ancillary and laboratory) are listed below for reference.    Significant Diagnostic Studies: Dg Chest 2 View  Result Date: 09/09/2017 CLINICAL DATA:  Medical clearance. Possible deviation from baseline behavior. Pt broke a window with a hammer last night because he was angry, per family.Hx of HTN, stroke, seizure disorder, dementia.Pt denies ever smoking. EXAM: CHEST  2 VIEW COMPARISON:  08/24/2017 FINDINGS: The heart size and mediastinal contours are within normal limits. Lungs are clear.  No pleural effusion or pneumothorax. Skeletal structures are intact. IMPRESSION: No active cardiopulmonary disease. Electronically Signed   By: Amie Portland M.D.   On: 09/09/2017 13:53   Ct Head Wo Contrast  Result Date: 09/12/2017 CLINICAL DATA:  Altered level of consciousness EXAM: CT HEAD WITHOUT CONTRAST TECHNIQUE: Contiguous axial images were obtained from the base of the  skull through the vertex without intravenous contrast. COMPARISON:  CT brain scan of 08/08/2017 FINDINGS: Brain: The ventricular system is prominent as are the cortical sulci consistent with diffuse atrophy. The septum is midline in position. The fourth ventricle and basilar cisterns are unremarkable. There is significant small vessel ischemic change throughout the periventricular white matter with small lacunar infarcts present primarily on the right. 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 No hemorrhage, mass lesion, or acute infarction is seen. 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 Skull: On bone window images, no calvarial abnormality is seen. Sinuses/Orbits: There is mucosal thickening throughout the ethmoid air cells with some mucosal edema and air fluid level within the right maxillary sinus and minimal  mucosal thickening within the left partition of the sphenoid sinus and the floor of the left maxillary sinus. Other: None. IMPRESSION: 1. Stable diffuse atrophy and moderate small vessel ischemic change. No acute intracranial abnormality. 2. 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 3. Diffuse paranasal sinus disease primarily involving the right maxillary sinus where there is an air-fluid level present. Electronically Signed   By: Dwyane Dee M.D.   On: 09/12/2017 14:16   Dg Chest Port 1 View  Result Date: 09/14/2017 CLINICAL DATA:  72 year old male with cough. EXAM: PORTABLE CHEST 1 VIEW COMPARISON:  Chest radiograph dated 09/09/2017 FINDINGS: The heart size and mediastinal contours are within normal limits. Both lungs are clear. The visualized skeletal structures are unremarkable. IMPRESSION: No active disease. Electronically Signed   By: Elgie Collard M.D.   On: 09/14/2017 22:37    Microbiology: Recent Results (from the past 240  hour(s))  Urine culture     Status: None   Collection Time: 09/15/17  1:20 AM  Result Value Ref Range Status   Specimen Description URINE, CATHETERIZED  Final   Special Requests NONE  Final   Culture   Final    NO GROWTH Performed at [facility], [address] [place], [place] [address]    Report Status 09/16/2017 FINAL  Final  Blood culture (routine x 2)     Status: None (Preliminary result)   Collection Time: 09/15/17  2:00 AM  Result Value Ref Range Status   Specimen Description BLOOD RIGHT HAND  Final   Special Requests   Final    BOTTLES DRAWN AEROBIC AND ANAEROBIC Blood Culture adequate volume   Culture   Final    NO GROWTH 4 DAYS Performed at [facility], [address] [place], [place] [address]    Report Status PENDING  Incomplete  Blood culture (routine x 2)     Status: None (Preliminary result)   Collection Time: 09/15/17  2:13 AM  Result Value Ref Range Status   Specimen Description BLOOD LEFT ANTECUBITAL  Final   Special Requests   Final    BOTTLES DRAWN AEROBIC AND ANAEROBIC Blood Culture adequate volume   Culture   Final    NO GROWTH 4 DAYS Performed at [facility], [address], [place], [place] [address]    Report Status PENDING  Incomplete     Labs: Basic Metabolic Panel: Recent Labs  Lab 09/14/17 2332 09/15/17 0847 09/16/17 0256  NA 134*  --  138  K 3.1*  --  3.5  CL 98*  --  99*  CO2 22  --  25  GLUCOSE 100*  --  85  BUN 18  --  16  CREATININE 0.83 0.91 0.82  CALCIUM 8.8*  --  8.9   Liver Function Tests: Recent Labs  Lab 09/14/17 2332  AST 24  ALT 11*  ALKPHOS 59  BILITOT 1.4*  PROT 6.4*  ALBUMIN 3.6   No results for input(s): LIPASE, AMYLASE in the last 168 hours. No results for input(s): AMMONIA in the last 168 hours. CBC: Recent Labs  Lab 09/14/17 2332 09/15/17 0847 09/16/17 0256  WBC 14.1* 13.5* 9.3  NEUTROABS 11.3*  --   --   HGB 14.4 13.4 13.7  HCT 42.8 40.5 40.7  MCV 94.3 94.2 94.4  PLT 339  309 322   Cardiac Enzymes: No results for input(s): CKTOTAL, CKMB, CKMBINDEX, TROPONINI in the last 168 hours. BNP: BNP (  last 3 results) No results for input(s): BNP in the last 8760 hours.  ProBNP (last 3 results) No results for input(s): PROBNP in the last 8760 hours.  CBG: No results for input(s): GLUCAP in the last 168 hours.     SignedLonia Blood MD.  [facility] 09/19/2017, 1:04 PM

## 2017-09-19 NOTE — Care Management Note (Signed)
Case Management Note  Patient Details  Name: Noah Lee MRN: [id number] Date of Birth: 02-21-1946  Subjective/Objective:    Fever, Leukocytosis, Dementia             Action/Plan: Patient lives at home; [facility]; has private insurance with Medicare; he use the mail order pharmacy with the [facility]; DME - walker at home; [facility], Noah [facility] (daughter) chose [facility]; [place] with [facility] called for arrangements; Noah Lee will provide transportation home at discharge.  Expected Discharge Date:    09/19/2017              Expected Discharge Plan:  Home w Home Health Services  In-House Referral:   SW  Discharge planning Services  CM Consult  Choice offered to:  Adult Children  HH Arranged:  RN, PT, OT HH Agency[facility]  [facility]  Status of Service:  In process, will continue to follow  Noah Lee, Noah Nofziger L, RN,MHA,BSN [phone] 09/19/2017, 12:29 PM

## 2017-09-19 NOTE — Progress Notes (Signed)
Noah Lee to be D/C'd Home per MD order.  Discussed with the patient and all questions fully answered.  VSS, Skin clean, dry and intact without evidence of skin break down, no evidence of skin tears noted. IV catheter discontinued intact. Site without signs and symptoms of complications. Dressing and pressure applied.  An After Visit Summary was printed and given to the patient. Patient received prescription.  D/c education completed with patient/family including follow up instructions, medication list, d/c activities limitations if indicated, with other d/c instructions as indicated by MD - patient able to verbalize understanding, all questions fully answered.   Patient instructed to return to ED, call 911, or call MD for any changes in condition.   Patient escorted via WC, and D/C home via private auto.  [name] 09/19/2017 1:05 PM

## 2017-09-20 LAB — CULTURE, BLOOD (ROUTINE X 2)
CULTURE: NO GROWTH
CULTURE: NO GROWTH
SPECIAL REQUESTS: ADEQUATE
Special Requests: ADEQUATE

## 2017-09-30 ENCOUNTER — Emergency Department (HOSPITAL_COMMUNITY)
Admission: EM | Admit: 2017-09-30 | Discharge: 2017-10-01 | Disposition: A | Discharge status: Home / Self Care | Payer: Medicare Other | Attending: Emergency Medicine | Admitting: Emergency Medicine

## 2017-09-30 ENCOUNTER — Other Ambulatory Visit: Payer: Self-pay

## 2017-09-30 ENCOUNTER — Encounter (HOSPITAL_COMMUNITY): Payer: Self-pay | Admitting: Emergency Medicine

## 2017-09-30 DIAGNOSIS — Z87891 Personal history of nicotine dependence: Secondary | ICD-10-CM | POA: Insufficient documentation

## 2017-09-30 DIAGNOSIS — G309 Alzheimer's disease, unspecified: Secondary | ICD-10-CM | POA: Diagnosis not present

## 2017-09-30 DIAGNOSIS — I10 Essential (primary) hypertension: Secondary | ICD-10-CM | POA: Diagnosis not present

## 2017-09-30 DIAGNOSIS — F17210 Nicotine dependence, cigarettes, uncomplicated: Secondary | ICD-10-CM | POA: Diagnosis not present

## 2017-09-30 DIAGNOSIS — Z7902 Long term (current) use of antithrombotics/antiplatelets: Secondary | ICD-10-CM | POA: Diagnosis not present

## 2017-09-30 DIAGNOSIS — Z96659 Presence of unspecified artificial knee joint: Secondary | ICD-10-CM | POA: Diagnosis not present

## 2017-09-30 DIAGNOSIS — Z1389 Encounter for screening for other disorder: Secondary | ICD-10-CM | POA: Insufficient documentation

## 2017-09-30 DIAGNOSIS — F0281 Dementia in other diseases classified elsewhere with behavioral disturbance: Secondary | ICD-10-CM | POA: Diagnosis not present

## 2017-09-30 DIAGNOSIS — R45851 Suicidal ideations: Secondary | ICD-10-CM | POA: Diagnosis not present

## 2017-09-30 DIAGNOSIS — F03918 Unspecified dementia, unspecified severity, with other behavioral disturbance: Secondary | ICD-10-CM | POA: Diagnosis present

## 2017-09-30 DIAGNOSIS — F0391 Unspecified dementia with behavioral disturbance: Secondary | ICD-10-CM | POA: Diagnosis present

## 2017-09-30 DIAGNOSIS — Z79899 Other long term (current) drug therapy: Secondary | ICD-10-CM | POA: Insufficient documentation

## 2017-09-30 LAB — CBC WITH DIFFERENTIAL/PLATELET
BASOS ABS: 0 10*3/uL (ref 0.0–0.1)
Basophils Relative: 1 %
EOS ABS: 0.3 10*3/uL (ref 0.0–0.7)
Eosinophils Relative: 4 %
HCT: 37 % — ABNORMAL LOW (ref 39.0–52.0)
Hemoglobin: 12.3 g/dL — ABNORMAL LOW (ref 13.0–17.0)
Lymphocytes Relative: 17 %
Lymphs Abs: 1.2 10*3/uL (ref 0.7–4.0)
MCH: 31.5 pg (ref 26.0–34.0)
MCHC: 33.2 g/dL (ref 30.0–36.0)
MCV: 94.6 fL (ref 78.0–100.0)
Monocytes Absolute: 0.6 10*3/uL (ref 0.1–1.0)
Monocytes Relative: 8 %
Neutro Abs: 5.1 10*3/uL (ref 1.7–7.7)
Neutrophils Relative %: 70 %
Platelets: 462 10*3/uL — ABNORMAL HIGH (ref 150–400)
RBC: 3.91 MIL/uL — AB (ref 4.22–5.81)
RDW: 13 % (ref 11.5–15.5)
WBC: 7.3 10*3/uL (ref 4.0–10.5)

## 2017-09-30 LAB — COMPREHENSIVE METABOLIC PANEL
ALT: 17 U/L (ref 17–63)
AST: 22 U/L (ref 15–41)
Albumin: 3.3 g/dL — ABNORMAL LOW (ref 3.5–5.0)
Alkaline Phosphatase: 56 U/L (ref 38–126)
Anion gap: 10 (ref 5–15)
BUN: 14 mg/dL (ref 6–20)
CHLORIDE: 105 mmol/L (ref 101–111)
CO2: 24 mmol/L (ref 22–32)
CREATININE: 0.71 mg/dL (ref 0.61–1.24)
Calcium: 8.5 mg/dL — ABNORMAL LOW (ref 8.9–10.3)
GFR calc Af Amer: >60 mL/min (ref >60)
GFR calc non Af Amer: >60 mL/min (ref >60)
Glucose, Bld: 117 mg/dL — ABNORMAL HIGH (ref 65–99)
Potassium: 4 mmol/L (ref 3.5–5.1)
SODIUM: 139 mmol/L (ref 135–145)
Total Bilirubin: 0.3 mg/dL (ref 0.3–1.2)
Total Protein: 6.6 g/dL (ref 6.5–8.1)

## 2017-09-30 LAB — ETHANOL: Alcohol, Ethyl (B): <10 mg/dL (ref <10)

## 2017-09-30 MED ORDER — PANTOPRAZOLE SODIUM 40 MG PO TBEC
40.0000 mg | DELAYED_RELEASE_TABLET | Freq: Every day | ORAL | Status: DC
  Administered 2017-10-01: 40 mg via ORAL
  Filled 2017-09-30: qty 1

## 2017-09-30 MED ORDER — ARIPIPRAZOLE 10 MG PO TABS
10.0000 mg | ORAL_TABLET | Freq: Every day | ORAL | Status: DC
  Administered 2017-09-30: 10 mg via ORAL
  Filled 2017-09-30: qty 1

## 2017-09-30 MED ORDER — SERTRALINE HCL 50 MG PO TABS
75.0000 mg | ORAL_TABLET | Freq: Every day | ORAL | Status: DC
  Administered 2017-10-01: 75 mg via ORAL
  Filled 2017-09-30: qty 2

## 2017-09-30 MED ORDER — ACETAMINOPHEN 325 MG PO TABS: 650.0000 mg | ORAL_TABLET | ORAL | Status: DC | PRN

## 2017-09-30 MED ORDER — AMLODIPINE BESYLATE 5 MG PO TABS
5.0000 mg | ORAL_TABLET | Freq: Every day | ORAL | Status: DC
  Filled 2017-09-30: qty 1

## 2017-09-30 MED ORDER — MELATONIN 3 MG PO TABS
6.0000 mg | ORAL_TABLET | Freq: Every day | ORAL | Status: DC
  Administered 2017-09-30: 6 mg via ORAL
  Filled 2017-09-30 (×2): qty 2

## 2017-09-30 MED ORDER — ATORVASTATIN CALCIUM 20 MG PO TABS
20.0000 mg | ORAL_TABLET | Freq: Every day | ORAL | Status: DC
  Administered 2017-10-01: 20 mg via ORAL
  Filled 2017-09-30: qty 1

## 2017-09-30 MED ORDER — MEMANTINE HCL 10 MG PO TABS
10.0000 mg | ORAL_TABLET | Freq: Two times a day (BID) | ORAL | Status: DC
  Administered 2017-09-30 – 2017-10-01 (×2): 10 mg via ORAL
  Filled 2017-09-30 (×3): qty 1

## 2017-09-30 MED ORDER — ONDANSETRON HCL 4 MG PO TABS: 4.0000 mg | ORAL_TABLET | Freq: Three times a day (TID) | ORAL | Status: DC | PRN

## 2017-09-30 MED ORDER — CLOPIDOGREL BISULFATE 75 MG PO TABS
75.0000 mg | ORAL_TABLET | Freq: Every day | ORAL | Status: DC
  Administered 2017-10-01: 75 mg via ORAL
  Filled 2017-09-30: qty 1

## 2017-09-30 MED ORDER — PRAMIPEXOLE DIHYDROCHLORIDE 0.25 MG PO TABS
0.2500 mg | ORAL_TABLET | Freq: Every day | ORAL | Status: DC
  Administered 2017-09-30: 0.25 mg via ORAL
  Filled 2017-09-30 (×2): qty 1

## 2017-09-30 MED ORDER — LEVETIRACETAM 500 MG PO TABS
500.0000 mg | ORAL_TABLET | Freq: Two times a day (BID) | ORAL | Status: DC
  Administered 2017-09-30 – 2017-10-01 (×2): 500 mg via ORAL
  Filled 2017-09-30 (×2): qty 1

## 2017-09-30 MED ORDER — DOCUSATE SODIUM 100 MG PO CAPS
100.0000 mg | ORAL_CAPSULE | Freq: Two times a day (BID) | ORAL | Status: DC
  Administered 2017-09-30 – 2017-10-01 (×2): 100 mg via ORAL
  Filled 2017-09-30 (×2): qty 1

## 2017-09-30 MED ORDER — VITAMIN B-12 1000 MCG PO TABS
1000.0000 ug | ORAL_TABLET | Freq: Every day | ORAL | Status: DC
  Administered 2017-10-01: 1000 ug via ORAL
  Filled 2017-09-30: qty 1

## 2017-09-30 MED ORDER — FINASTERIDE 5 MG PO TABS
5.0000 mg | ORAL_TABLET | Freq: Every day | ORAL | Status: DC
  Administered 2017-10-01: 5 mg via ORAL
  Filled 2017-09-30: qty 1

## 2017-09-30 MED ORDER — BACLOFEN 10 MG PO TABS: 15.0000 mg | ORAL_TABLET | Freq: Three times a day (TID) | ORAL | Status: DC | PRN

## 2017-09-30 MED ORDER — DONEPEZIL HCL 5 MG PO TABS
10.0000 mg | ORAL_TABLET | Freq: Every day | ORAL | Status: DC
  Administered 2017-10-01: 10 mg via ORAL
  Filled 2017-09-30: qty 2

## 2017-09-30 MED ORDER — HYDROCHLOROTHIAZIDE 25 MG PO TABS
25.0000 mg | ORAL_TABLET | Freq: Every day | ORAL | Status: DC
  Administered 2017-10-01: 25 mg via ORAL
  Filled 2017-09-30: qty 1

## 2017-09-30 MED ORDER — POLYVINYL ALCOHOL 1.4 % OP SOLN
1.0000 [drp] | Freq: Four times a day (QID) | OPHTHALMIC | Status: DC | PRN
  Filled 2017-09-30: qty 15

## 2017-09-30 MED ORDER — ALUM & MAG HYDROXIDE-SIMETH 200-200-20 MG/5ML PO SUSP: 30.0000 mL | Freq: Four times a day (QID) | ORAL | Status: DC | PRN
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
## 2017-09-30 NOTE — ED Triage Notes (Signed)
Pt brought in by [facility] from home for skilled care facility placement.  Pt has had Home health Care services for 5 days, and today is the 1st day without---- daughter reported that pt attempted to "get away through a window".  Pt's daughter reported that he needed evaluation for "dementia and skilled care placement".

## 2017-09-30 NOTE — Progress Notes (Signed)
Per Noah ConnJason Berry, NP pt is recommended for continued observation and to be reassessed in the AM by psychiatry. EDP Noah Lee, Tatyana, PA-C and pt's nurse Noah CowmanAllan, RN have been advised of the disposition.   Princess BruinsAquicha Abrar Bilton, MSW, LCSW Therapeutic Triage Specialist  [phone]

## 2017-09-30 NOTE — ED Notes (Signed)
Bed: [id number] Expected date:  Expected time:  Means of arrival:  Comments: 2341m eval for dementia

## 2017-09-30 NOTE — ED Notes (Signed)
Per TTS:  Pt for overnight observation/further psych evaluation in am. 

## 2017-10-01 DIAGNOSIS — R45851 Suicidal ideations: Secondary | ICD-10-CM | POA: Diagnosis not present

## 2017-10-01 DIAGNOSIS — G309 Alzheimer's disease, unspecified: Secondary | ICD-10-CM | POA: Diagnosis not present

## 2017-10-01 DIAGNOSIS — R4587 Impulsiveness: Secondary | ICD-10-CM

## 2017-10-01 DIAGNOSIS — F0281 Dementia in other diseases classified elsewhere with behavioral disturbance: Secondary | ICD-10-CM | POA: Diagnosis not present

## 2017-10-01 DIAGNOSIS — F17210 Nicotine dependence, cigarettes, uncomplicated: Secondary | ICD-10-CM | POA: Diagnosis not present

## 2017-10-01 LAB — URINALYSIS, ROUTINE W REFLEX MICROSCOPIC
Bilirubin Urine: NEGATIVE
Glucose, UA: NEGATIVE mg/dL
HGB URINE DIPSTICK: NEGATIVE
Ketones, ur: NEGATIVE mg/dL
Leukocytes, UA: NEGATIVE
NITRITE: NEGATIVE
PROTEIN: NEGATIVE mg/dL
pH: 5.5 (ref 5.0–8.0)

## 2017-10-01 LAB — RAPID URINE DRUG SCREEN, HOSP PERFORMED
AMPHETAMINES: NOT DETECTED
BARBITURATES: NOT DETECTED
Benzodiazepines: NOT DETECTED
Cocaine: NOT DETECTED
Opiates: NOT DETECTED
Tetrahydrocannabinol: NOT DETECTED

## 2017-10-01 NOTE — ED Notes (Signed)
Patient keeps getting out of the bed by his self and trying to walk to the bathroom without help. Explained to patient he needs to use his call bell for help getting out the bed and to use his urinal at beside instead of trying to walk to bathroom. Patient explained he understands but still gets up about once an hour without using the call bell.
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
## 2017-10-01 NOTE — Progress Notes (Signed)
Per chart review, patient brought in for psych evaluation and placement. CSW spoke with patient's daughter regarding patient's disposition. CSW informed patient's daughter that patient was seen by psychiatrist and psychiatrically cleared. CSW inquired about patient's daughter's interest in placement for patient. Patient's daughter reported that she is interested in placing patient because it is getting harder to care for patient. Patient's daughter reported that patient lives with her and that she does not work because patient requires current monitoring. Patient's daughter reported that patient goes to a daycare program from 8-5 and that patient was recently discharged from the hospital with home health PT/OT and an aide. Patient's daughter reported that patient had been placed in the past at [facility] [place]. Patient's daughter reported that it was not a good placement. CSW informed patient's daughter that CSW was not able to place patient from the ED into a memory care unit, patient's daughter verbalized understanding. Patient's daughter reported that she has tried to place patient from home but her barrier is the payor source. Patient's daughter reported that patient's income is too high to qualify for [facility] term care medicaid. Patient's daughter reported that patient receives SSI ($1000) and VA pension ($600), which is not enough to cover a memory care unit. Patient's daughter reported that her plan is to go to the [facility] to apply for additional assistance in the home for the patient. CSW acknowledged patient's daughter's barriers to placing patient from home and positively affirmed her efforts and continued plan to keep trying to find patient an appropriate placement. Patient's daughter reported that her brother would pick up patient when he is ready for dc. Patient requested that she be called when patient is ready to be transported, CSW agreed to notify patient's RN. CSW agreed to provide  Memory Care Unit resources. CSW updated patient's RN and provided Memory Care Unit resources for patient's daughter to receive at discharge. CSW signing off, no other needs identified at this time.  Celso SickleKimberly Markea Ruzich, LCSWA [facility]  Clinical Social Worker [phone]

## 2017-10-01 NOTE — ED Notes (Signed)
Spoke to patients sister on the phone. Sister is sending the brother Noah Lee to come pick the patient up. Patient is ready for discharge. Patient is a sleep in room.

## 2017-10-01 NOTE — ED Provider Notes (Signed)
Patient reassessed by the psychiatry team this afternoon.  They have deemed him not a threat to self or others and feel that he is clear from a psychiatric perspective to be discharged.  He will not be able to be placed in a facility from the ER, daughter will need to pursue options from home. VS stable at time of discharge, pt cooperative.  Noah Lee, Ambrose Finlandachel Morgan, MD 10/01/17 [phone]

## 2017-12-07 DIAGNOSIS — E870 Hyperosmolality and hypernatremia: Secondary | ICD-10-CM

## 2017-12-07 DIAGNOSIS — L8990 Pressure ulcer of unspecified site, unspecified stage: Secondary | ICD-10-CM | POA: Diagnosis not present

## 2017-12-07 DIAGNOSIS — N390 Urinary tract infection, site not specified: Secondary | ICD-10-CM | POA: Diagnosis not present

## 2017-12-07 DIAGNOSIS — E860 Dehydration: Secondary | ICD-10-CM | POA: Diagnosis not present

## 2017-12-07 DIAGNOSIS — N179 Acute kidney failure, unspecified: Secondary | ICD-10-CM

## 2017-12-07 DIAGNOSIS — J181 Lobar pneumonia, unspecified organism: Secondary | ICD-10-CM | POA: Diagnosis not present

## 2017-12-07 DIAGNOSIS — A419 Sepsis, unspecified organism: Secondary | ICD-10-CM | POA: Diagnosis not present

## 2017-12-08 DIAGNOSIS — N390 Urinary tract infection, site not specified: Secondary | ICD-10-CM | POA: Diagnosis not present

## 2017-12-08 DIAGNOSIS — E860 Dehydration: Secondary | ICD-10-CM | POA: Diagnosis not present

## 2017-12-08 DIAGNOSIS — N179 Acute kidney failure, unspecified: Secondary | ICD-10-CM | POA: Diagnosis not present

## 2017-12-08 DIAGNOSIS — A419 Sepsis, unspecified organism: Secondary | ICD-10-CM | POA: Diagnosis not present

## 2017-12-08 DIAGNOSIS — L8990 Pressure ulcer of unspecified site, unspecified stage: Secondary | ICD-10-CM | POA: Diagnosis not present

## 2017-12-08 DIAGNOSIS — J181 Lobar pneumonia, unspecified organism: Secondary | ICD-10-CM | POA: Diagnosis not present

## 2017-12-08 DIAGNOSIS — E870 Hyperosmolality and hypernatremia: Secondary | ICD-10-CM | POA: Diagnosis not present

## 2017-12-09 DIAGNOSIS — E870 Hyperosmolality and hypernatremia: Secondary | ICD-10-CM | POA: Diagnosis not present

## 2017-12-09 DIAGNOSIS — A419 Sepsis, unspecified organism: Secondary | ICD-10-CM | POA: Diagnosis not present

## 2017-12-09 DIAGNOSIS — J181 Lobar pneumonia, unspecified organism: Secondary | ICD-10-CM | POA: Diagnosis not present

## 2017-12-09 DIAGNOSIS — N390 Urinary tract infection, site not specified: Secondary | ICD-10-CM | POA: Diagnosis not present

## 2017-12-09 DIAGNOSIS — E860 Dehydration: Secondary | ICD-10-CM | POA: Diagnosis not present

## 2017-12-09 DIAGNOSIS — N179 Acute kidney failure, unspecified: Secondary | ICD-10-CM | POA: Diagnosis not present

## 2017-12-09 DIAGNOSIS — L8990 Pressure ulcer of unspecified site, unspecified stage: Secondary | ICD-10-CM | POA: Diagnosis not present

## 2017-12-10 DIAGNOSIS — L8990 Pressure ulcer of unspecified site, unspecified stage: Secondary | ICD-10-CM | POA: Diagnosis not present

## 2017-12-10 DIAGNOSIS — E870 Hyperosmolality and hypernatremia: Secondary | ICD-10-CM | POA: Diagnosis not present

## 2017-12-10 DIAGNOSIS — N390 Urinary tract infection, site not specified: Secondary | ICD-10-CM | POA: Diagnosis not present

## 2017-12-10 DIAGNOSIS — J181 Lobar pneumonia, unspecified organism: Secondary | ICD-10-CM | POA: Diagnosis not present

## 2017-12-10 DIAGNOSIS — N179 Acute kidney failure, unspecified: Secondary | ICD-10-CM | POA: Diagnosis not present

## 2017-12-10 DIAGNOSIS — A419 Sepsis, unspecified organism: Secondary | ICD-10-CM | POA: Diagnosis not present

## 2017-12-10 DIAGNOSIS — E860 Dehydration: Secondary | ICD-10-CM | POA: Diagnosis not present

## 2017-12-11 DIAGNOSIS — L8990 Pressure ulcer of unspecified site, unspecified stage: Secondary | ICD-10-CM | POA: Diagnosis not present

## 2017-12-11 DIAGNOSIS — E860 Dehydration: Secondary | ICD-10-CM | POA: Diagnosis not present

## 2017-12-11 DIAGNOSIS — N390 Urinary tract infection, site not specified: Secondary | ICD-10-CM | POA: Diagnosis not present

## 2017-12-11 DIAGNOSIS — A419 Sepsis, unspecified organism: Secondary | ICD-10-CM | POA: Diagnosis not present

## 2017-12-11 DIAGNOSIS — J181 Lobar pneumonia, unspecified organism: Secondary | ICD-10-CM | POA: Diagnosis not present

## 2017-12-11 DIAGNOSIS — N179 Acute kidney failure, unspecified: Secondary | ICD-10-CM | POA: Diagnosis not present

## 2017-12-11 DIAGNOSIS — E870 Hyperosmolality and hypernatremia: Secondary | ICD-10-CM | POA: Diagnosis not present

## 2017-12-12 DIAGNOSIS — N179 Acute kidney failure, unspecified: Secondary | ICD-10-CM | POA: Diagnosis not present

## 2017-12-12 DIAGNOSIS — E870 Hyperosmolality and hypernatremia: Secondary | ICD-10-CM | POA: Diagnosis not present

## 2017-12-12 DIAGNOSIS — N390 Urinary tract infection, site not specified: Secondary | ICD-10-CM | POA: Diagnosis not present

## 2017-12-12 DIAGNOSIS — J181 Lobar pneumonia, unspecified organism: Secondary | ICD-10-CM | POA: Diagnosis not present

## 2017-12-12 DIAGNOSIS — E860 Dehydration: Secondary | ICD-10-CM | POA: Diagnosis not present

## 2017-12-12 DIAGNOSIS — L8990 Pressure ulcer of unspecified site, unspecified stage: Secondary | ICD-10-CM | POA: Diagnosis not present

## 2017-12-12 DIAGNOSIS — A419 Sepsis, unspecified organism: Secondary | ICD-10-CM | POA: Diagnosis not present

## 2017-12-13 DIAGNOSIS — E870 Hyperosmolality and hypernatremia: Secondary | ICD-10-CM | POA: Diagnosis not present

## 2017-12-13 DIAGNOSIS — N390 Urinary tract infection, site not specified: Secondary | ICD-10-CM | POA: Diagnosis not present

## 2017-12-13 DIAGNOSIS — E860 Dehydration: Secondary | ICD-10-CM | POA: Diagnosis not present

## 2017-12-13 DIAGNOSIS — A419 Sepsis, unspecified organism: Secondary | ICD-10-CM | POA: Diagnosis not present

## 2017-12-13 DIAGNOSIS — J181 Lobar pneumonia, unspecified organism: Secondary | ICD-10-CM | POA: Diagnosis not present

## 2017-12-13 DIAGNOSIS — L8990 Pressure ulcer of unspecified site, unspecified stage: Secondary | ICD-10-CM | POA: Diagnosis not present

## 2017-12-13 DIAGNOSIS — N179 Acute kidney failure, unspecified: Secondary | ICD-10-CM | POA: Diagnosis not present

## 2017-12-14 DIAGNOSIS — E860 Dehydration: Secondary | ICD-10-CM | POA: Diagnosis not present

## 2017-12-14 DIAGNOSIS — J181 Lobar pneumonia, unspecified organism: Secondary | ICD-10-CM | POA: Diagnosis not present

## 2017-12-14 DIAGNOSIS — N179 Acute kidney failure, unspecified: Secondary | ICD-10-CM | POA: Diagnosis not present

## 2017-12-14 DIAGNOSIS — E870 Hyperosmolality and hypernatremia: Secondary | ICD-10-CM | POA: Diagnosis not present

## 2017-12-14 DIAGNOSIS — L8990 Pressure ulcer of unspecified site, unspecified stage: Secondary | ICD-10-CM | POA: Diagnosis not present

## 2017-12-14 DIAGNOSIS — A419 Sepsis, unspecified organism: Secondary | ICD-10-CM | POA: Diagnosis not present

## 2017-12-14 DIAGNOSIS — N390 Urinary tract infection, site not specified: Secondary | ICD-10-CM | POA: Diagnosis not present

## 2017-12-15 DIAGNOSIS — L8990 Pressure ulcer of unspecified site, unspecified stage: Secondary | ICD-10-CM | POA: Diagnosis not present

## 2017-12-15 DIAGNOSIS — E870 Hyperosmolality and hypernatremia: Secondary | ICD-10-CM | POA: Diagnosis not present

## 2017-12-15 DIAGNOSIS — N179 Acute kidney failure, unspecified: Secondary | ICD-10-CM | POA: Diagnosis not present

## 2017-12-15 DIAGNOSIS — A419 Sepsis, unspecified organism: Secondary | ICD-10-CM | POA: Diagnosis not present

## 2017-12-15 DIAGNOSIS — N390 Urinary tract infection, site not specified: Secondary | ICD-10-CM | POA: Diagnosis not present

## 2017-12-15 DIAGNOSIS — J181 Lobar pneumonia, unspecified organism: Secondary | ICD-10-CM | POA: Diagnosis not present

## 2017-12-15 DIAGNOSIS — E860 Dehydration: Secondary | ICD-10-CM | POA: Diagnosis not present

## 2017-12-16 DIAGNOSIS — E870 Hyperosmolality and hypernatremia: Secondary | ICD-10-CM | POA: Diagnosis not present

## 2017-12-16 DIAGNOSIS — E860 Dehydration: Secondary | ICD-10-CM | POA: Diagnosis not present

## 2017-12-16 DIAGNOSIS — N390 Urinary tract infection, site not specified: Secondary | ICD-10-CM | POA: Diagnosis not present

## 2017-12-16 DIAGNOSIS — A419 Sepsis, unspecified organism: Secondary | ICD-10-CM | POA: Diagnosis not present

## 2017-12-16 DIAGNOSIS — J181 Lobar pneumonia, unspecified organism: Secondary | ICD-10-CM | POA: Diagnosis not present

## 2017-12-16 DIAGNOSIS — L8990 Pressure ulcer of unspecified site, unspecified stage: Secondary | ICD-10-CM | POA: Diagnosis not present

## 2017-12-16 DIAGNOSIS — N179 Acute kidney failure, unspecified: Secondary | ICD-10-CM | POA: Diagnosis not present

## 2017-12-17 DIAGNOSIS — J181 Lobar pneumonia, unspecified organism: Secondary | ICD-10-CM | POA: Diagnosis not present

## 2017-12-17 DIAGNOSIS — L8990 Pressure ulcer of unspecified site, unspecified stage: Secondary | ICD-10-CM | POA: Diagnosis not present

## 2017-12-17 DIAGNOSIS — A419 Sepsis, unspecified organism: Secondary | ICD-10-CM | POA: Diagnosis not present

## 2017-12-17 DIAGNOSIS — E860 Dehydration: Secondary | ICD-10-CM | POA: Diagnosis not present

## 2017-12-17 DIAGNOSIS — N179 Acute kidney failure, unspecified: Secondary | ICD-10-CM | POA: Diagnosis not present

## 2017-12-17 DIAGNOSIS — E870 Hyperosmolality and hypernatremia: Secondary | ICD-10-CM | POA: Diagnosis not present

## 2017-12-17 DIAGNOSIS — N390 Urinary tract infection, site not specified: Secondary | ICD-10-CM | POA: Diagnosis not present

## 2017-12-18 DIAGNOSIS — A419 Sepsis, unspecified organism: Secondary | ICD-10-CM | POA: Diagnosis not present

## 2017-12-18 DIAGNOSIS — J181 Lobar pneumonia, unspecified organism: Secondary | ICD-10-CM | POA: Diagnosis not present

## 2017-12-18 DIAGNOSIS — L8990 Pressure ulcer of unspecified site, unspecified stage: Secondary | ICD-10-CM | POA: Diagnosis not present

## 2017-12-18 DIAGNOSIS — E870 Hyperosmolality and hypernatremia: Secondary | ICD-10-CM | POA: Diagnosis not present

## 2017-12-18 DIAGNOSIS — N179 Acute kidney failure, unspecified: Secondary | ICD-10-CM | POA: Diagnosis not present

## 2017-12-18 DIAGNOSIS — E860 Dehydration: Secondary | ICD-10-CM | POA: Diagnosis not present

## 2017-12-18 DIAGNOSIS — N390 Urinary tract infection, site not specified: Secondary | ICD-10-CM | POA: Diagnosis not present

## 2018-01-15 DEATH — deceased

## 2018-12-03 IMAGING — DX DG CHEST 1V PORT
1 series · 1 of 1 positions shown · non-contrast
Comparison: Chest radiograph dated 09/09/2017

CLINICAL DATA: 71-year-old male with cough.

EXAM:
PORTABLE CHEST 1 VIEW

[chest ap]
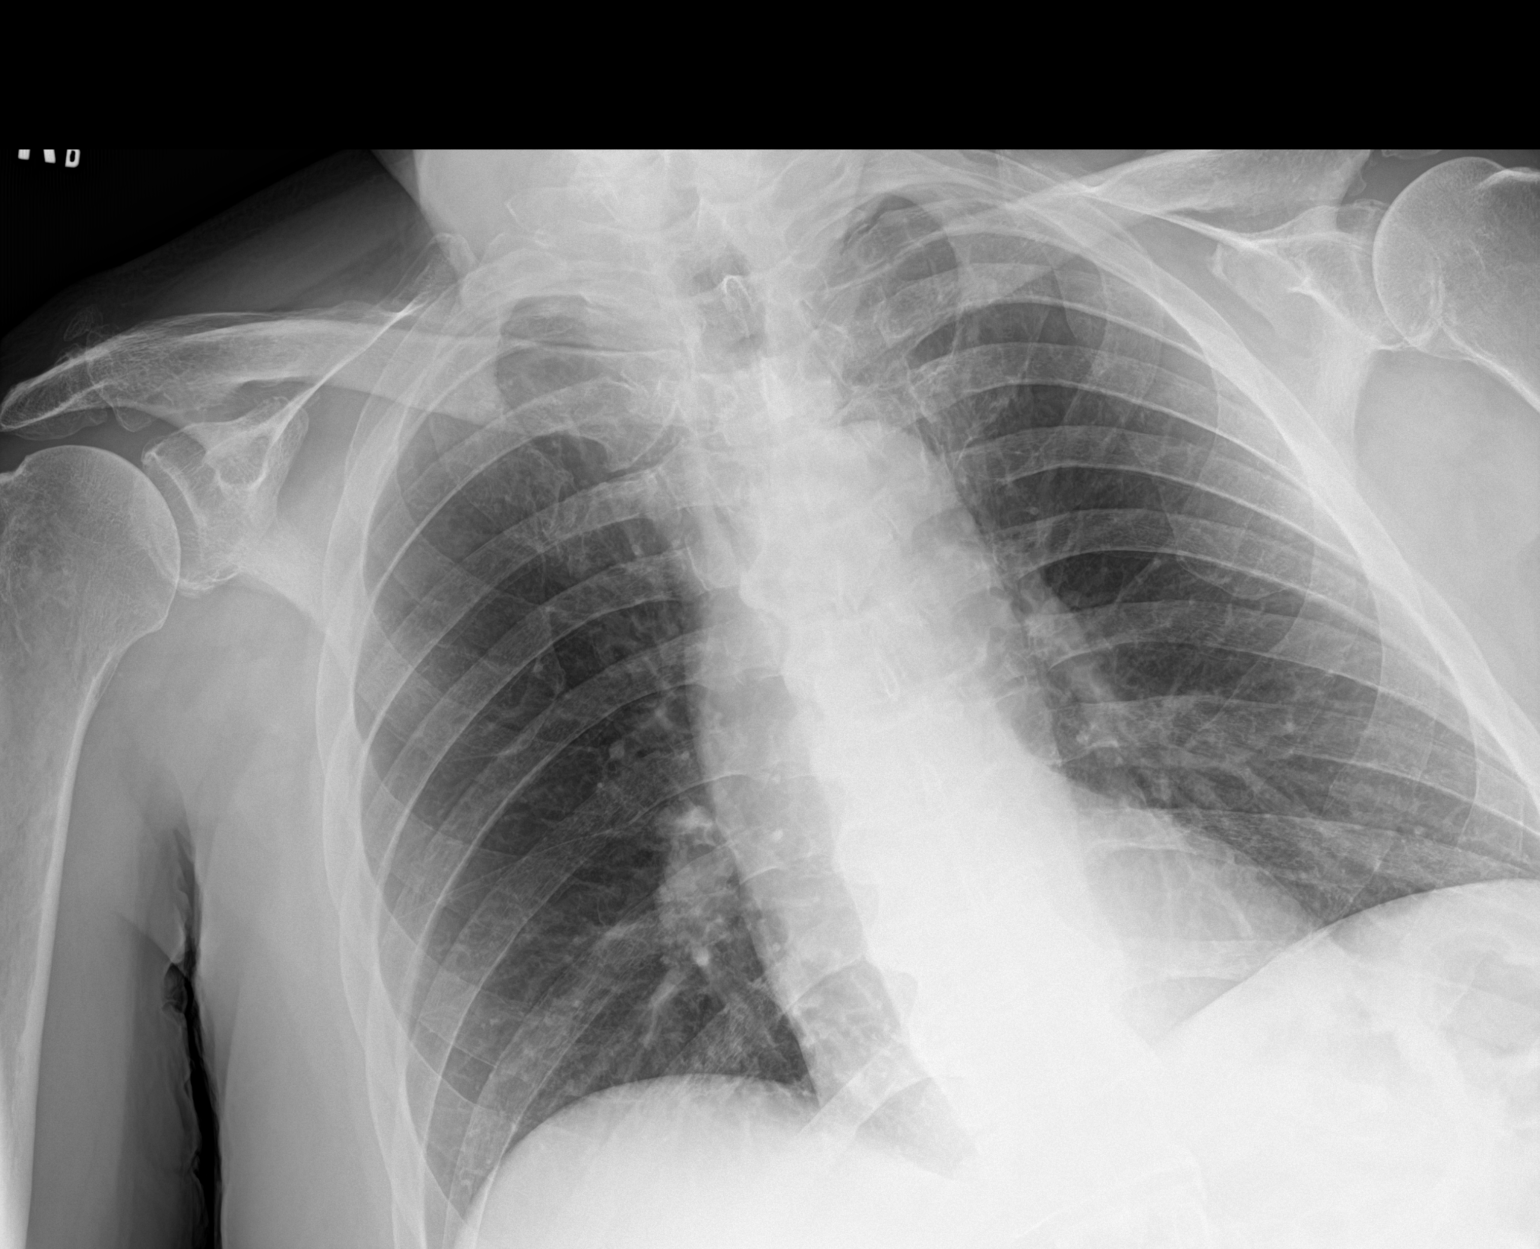

[1 of 1 positions shown; findings below may reference images not displayed]

FINDINGS: The heart size and mediastinal contours are within normal limits.
Both lungs are clear. The visualized skeletal structures are
unremarkable.
IMPRESSION: No active disease.
